# Patient Record
Sex: Male | Born: 1975
Health system: Southern US, Community
[De-identification: ages and names within clinical notes are randomized; demographics above are authoritative.]

## PROBLEM LIST (undated history)

## (undated) DIAGNOSIS — I10 Essential (primary) hypertension: Secondary | ICD-10-CM

## (undated) DIAGNOSIS — G4733 Obstructive sleep apnea (adult) (pediatric): Secondary | ICD-10-CM

## (undated) DIAGNOSIS — J189 Pneumonia, unspecified organism: Secondary | ICD-10-CM

## (undated) DIAGNOSIS — G479 Sleep disorder, unspecified: Secondary | ICD-10-CM

## (undated) DIAGNOSIS — Z720 Tobacco use: Secondary | ICD-10-CM

## (undated) DIAGNOSIS — E119 Type 2 diabetes mellitus without complications: Secondary | ICD-10-CM

## (undated) DIAGNOSIS — F32A Depression, unspecified: Secondary | ICD-10-CM

## (undated) DIAGNOSIS — F329 Major depressive disorder, single episode, unspecified: Secondary | ICD-10-CM

## (undated) DIAGNOSIS — A419 Sepsis, unspecified organism: Secondary | ICD-10-CM

## (undated) DIAGNOSIS — J309 Allergic rhinitis, unspecified: Secondary | ICD-10-CM

## (undated) DIAGNOSIS — E669 Obesity, unspecified: Secondary | ICD-10-CM

## (undated) DIAGNOSIS — D72829 Elevated white blood cell count, unspecified: Secondary | ICD-10-CM

## (undated) DIAGNOSIS — I509 Heart failure, unspecified: Secondary | ICD-10-CM

## (undated) DIAGNOSIS — F1010 Alcohol abuse, uncomplicated: Secondary | ICD-10-CM

## (undated) DIAGNOSIS — F17200 Nicotine dependence, unspecified, uncomplicated: Secondary | ICD-10-CM

## (undated) DIAGNOSIS — I519 Heart disease, unspecified: Secondary | ICD-10-CM

## (undated) DIAGNOSIS — J9600 Acute respiratory failure, unspecified whether with hypoxia or hypercapnia: Secondary | ICD-10-CM

## (undated) DIAGNOSIS — E785 Hyperlipidemia, unspecified: Secondary | ICD-10-CM

## (undated) HISTORY — DX: Pneumonia, unspecified organism: J18.9

## (undated) HISTORY — DX: Acute respiratory failure, unspecified whether with hypoxia or hypercapnia: J96.00

## (undated) HISTORY — DX: Elevated white blood cell count, unspecified: D72.829

## (undated) HISTORY — DX: Hyperlipidemia, unspecified: E78.5

## (undated) HISTORY — DX: Allergic rhinitis, unspecified: J30.9

## (undated) HISTORY — DX: Obstructive sleep apnea (adult) (pediatric): G47.33

## (undated) HISTORY — DX: Heart disease, unspecified: I51.9

## (undated) HISTORY — DX: Sleep disorder, unspecified: G47.9

## (undated) HISTORY — DX: Heart failure, unspecified: I50.9

## (undated) HISTORY — DX: Depression, unspecified: F32.A

## (undated) HISTORY — DX: Alcohol abuse, uncomplicated: F10.10

## (undated) HISTORY — DX: Sepsis, unspecified organism: A41.9

## (undated) HISTORY — DX: Major depressive disorder, single episode, unspecified: F32.9

## (undated) HISTORY — PX: NOSE SURGERY: SHX723

## (undated) HISTORY — DX: Nicotine dependence, unspecified, uncomplicated: F17.200

## (undated) HISTORY — DX: Tobacco use: Z72.0

---

## 2014-02-03 ENCOUNTER — Inpatient Hospital Stay (HOSPITAL_COMMUNITY)
Admission: EM | Admit: 2014-02-03 | Discharge: 2014-02-08 | DRG: 871 | Disposition: A | Discharge status: Home / Self Care | Payer: Medicare Other | Attending: Internal Medicine | Admitting: Internal Medicine

## 2014-02-03 ENCOUNTER — Encounter (HOSPITAL_COMMUNITY): Payer: Self-pay | Admitting: *Deleted

## 2014-02-03 ENCOUNTER — Emergency Department (HOSPITAL_COMMUNITY): Payer: Medicare Other

## 2014-02-03 DIAGNOSIS — J9601 Acute respiratory failure with hypoxia: Secondary | ICD-10-CM | POA: Diagnosis present

## 2014-02-03 DIAGNOSIS — IMO0001 Reserved for inherently not codable concepts without codable children: Secondary | ICD-10-CM | POA: Insufficient documentation

## 2014-02-03 DIAGNOSIS — J189 Pneumonia, unspecified organism: Secondary | ICD-10-CM | POA: Diagnosis present

## 2014-02-03 DIAGNOSIS — Z720 Tobacco use: Secondary | ICD-10-CM

## 2014-02-03 DIAGNOSIS — E870 Hyperosmolality and hypernatremia: Secondary | ICD-10-CM | POA: Diagnosis present

## 2014-02-03 DIAGNOSIS — R739 Hyperglycemia, unspecified: Secondary | ICD-10-CM | POA: Diagnosis present

## 2014-02-03 DIAGNOSIS — Z7982 Long term (current) use of aspirin: Secondary | ICD-10-CM

## 2014-02-03 DIAGNOSIS — Z6841 Body Mass Index (BMI) 40.0 and over, adult: Secondary | ICD-10-CM | POA: Diagnosis not present

## 2014-02-03 DIAGNOSIS — A419 Sepsis, unspecified organism: Secondary | ICD-10-CM

## 2014-02-03 DIAGNOSIS — R6520 Severe sepsis without septic shock: Secondary | ICD-10-CM | POA: Diagnosis present

## 2014-02-03 DIAGNOSIS — E876 Hypokalemia: Secondary | ICD-10-CM | POA: Diagnosis present

## 2014-02-03 DIAGNOSIS — G4733 Obstructive sleep apnea (adult) (pediatric): Secondary | ICD-10-CM | POA: Diagnosis present

## 2014-02-03 DIAGNOSIS — D72829 Elevated white blood cell count, unspecified: Secondary | ICD-10-CM | POA: Diagnosis present

## 2014-02-03 DIAGNOSIS — R509 Fever, unspecified: Secondary | ICD-10-CM

## 2014-02-03 DIAGNOSIS — J9600 Acute respiratory failure, unspecified whether with hypoxia or hypercapnia: Secondary | ICD-10-CM | POA: Diagnosis present

## 2014-02-03 HISTORY — DX: Obesity, unspecified: E66.9

## 2014-02-03 LAB — CBC WITH DIFFERENTIAL/PLATELET
BASOS ABS: 0 10*3/uL (ref 0.0–0.1)
Basophils Relative: 0 % (ref 0–1)
Eosinophils Absolute: 0 10*3/uL (ref 0.0–0.7)
Eosinophils Relative: 0 % (ref 0–5)
HCT: 42.1 % (ref 39.0–52.0)
Hemoglobin: 14.4 g/dL (ref 13.0–17.0)
Lymphocytes Relative: 6 % — ABNORMAL LOW (ref 12–46)
Lymphs Abs: 1.4 10*3/uL (ref 0.7–4.0)
MCH: 32.4 pg (ref 26.0–34.0)
MCHC: 34.2 g/dL (ref 30.0–36.0)
MCV: 94.8 fL (ref 78.0–100.0)
Monocytes Absolute: 2.7 10*3/uL — ABNORMAL HIGH (ref 0.1–1.0)
Monocytes Relative: 11 % (ref 3–12)
Neutro Abs: 20 10*3/uL — ABNORMAL HIGH (ref 1.7–7.7)
Neutrophils Relative %: 83 % — ABNORMAL HIGH (ref 43–77)
Platelets: 287 10*3/uL (ref 150–400)
RBC: 4.44 MIL/uL (ref 4.22–5.81)
RDW: 13.2 % (ref 11.5–15.5)
WBC: 24.2 10*3/uL — ABNORMAL HIGH (ref 4.0–10.5)

## 2014-02-03 LAB — URINALYSIS, ROUTINE W REFLEX MICROSCOPIC
GLUCOSE, UA: NEGATIVE mg/dL
KETONES UR: 15 mg/dL — AB
Nitrite: POSITIVE — AB
Protein, ur: 100 mg/dL — AB
Specific Gravity, Urine: 1.031 — ABNORMAL HIGH (ref 1.005–1.030)
Urobilinogen, UA: >8 mg/dL — ABNORMAL HIGH (ref 0.0–1.0)
pH: 5.5 (ref 5.0–8.0)

## 2014-02-03 LAB — I-STAT ARTERIAL BLOOD GAS, ED
Acid-Base Excess: 6 mmol/L — ABNORMAL HIGH (ref 0.0–2.0)
BICARBONATE: 28.4 meq/L — AB (ref 20.0–24.0)
O2 Saturation: 97 %
PH ART: 7.492 — AB (ref 7.350–7.450)
PO2 ART: 91 mmHg (ref 80.0–100.0)
Patient temperature: 102
TCO2: 29 mmol/L (ref 0–100)
pCO2 arterial: 37.7 mmHg (ref 35.0–45.0)

## 2014-02-03 LAB — COMPREHENSIVE METABOLIC PANEL
ALT: 36 U/L (ref 0–53)
AST: 29 U/L (ref 0–37)
Albumin: 3 g/dL — ABNORMAL LOW (ref 3.5–5.2)
Alkaline Phosphatase: 121 U/L — ABNORMAL HIGH (ref 39–117)
Anion gap: 18 — ABNORMAL HIGH (ref 5–15)
BUN: 11 mg/dL (ref 6–23)
CO2: 23 meq/L (ref 19–32)
Calcium: 9.3 mg/dL (ref 8.4–10.5)
Chloride: 95 mEq/L — ABNORMAL LOW (ref 96–112)
Creatinine, Ser: 0.69 mg/dL (ref 0.50–1.35)
GFR calc Af Amer: >90 mL/min (ref >90)
Glucose, Bld: 151 mg/dL — ABNORMAL HIGH (ref 70–99)
POTASSIUM: 4 meq/L (ref 3.7–5.3)
SODIUM: 136 meq/L — AB (ref 137–147)
Total Bilirubin: 0.7 mg/dL (ref 0.3–1.2)
Total Protein: 8.6 g/dL — ABNORMAL HIGH (ref 6.0–8.3)

## 2014-02-03 LAB — PROCALCITONIN: PROCALCITONIN: 0.61 ng/mL

## 2014-02-03 LAB — STREP PNEUMONIAE URINARY ANTIGEN: Strep Pneumo Urinary Antigen: NEGATIVE

## 2014-02-03 LAB — TROPONIN I: Troponin I: <0.3 ng/mL (ref <0.30)

## 2014-02-03 LAB — URINE MICROSCOPIC-ADD ON

## 2014-02-03 LAB — INFLUENZA PANEL BY PCR (TYPE A & B)
H1N1 flu by pcr: NOT DETECTED
Influenza A By PCR: NEGATIVE
Influenza B By PCR: NEGATIVE

## 2014-02-03 LAB — RAPID HIV SCREEN (WH-MAU): SUDS RAPID HIV SCREEN: NONREACTIVE

## 2014-02-03 LAB — I-STAT CG4 LACTIC ACID, ED
LACTIC ACID, VENOUS: 3.82 mmol/L — AB (ref 0.5–2.2)
Lactic Acid, Venous: 1.76 mmol/L (ref 0.5–2.2)

## 2014-02-03 LAB — CBG MONITORING, ED
GLUCOSE-CAPILLARY: 140 mg/dL — AB (ref 70–99)
Glucose-Capillary: 161 mg/dL — ABNORMAL HIGH (ref 70–99)

## 2014-02-03 LAB — GLUCOSE, CAPILLARY: Glucose-Capillary: 108 mg/dL — ABNORMAL HIGH (ref 70–99)

## 2014-02-03 LAB — PRO B NATRIURETIC PEPTIDE: PRO B NATRI PEPTIDE: 53.8 pg/mL (ref 0–125)

## 2014-02-03 LAB — LACTIC ACID, PLASMA: Lactic Acid, Venous: 1.2 mmol/L (ref 0.5–2.2)

## 2014-02-03 LAB — MRSA PCR SCREENING: MRSA by PCR: NEGATIVE

## 2014-02-03 IMAGING — CR DG CHEST 1V PORT
1 series · 1 of 1 positions shown · non-contrast
Comparison: None.

CLINICAL DATA: Fever and fatigue 2 days. Cough and shortness of
breath.

EXAM:
PORTABLE CHEST - 1 VIEW

[portable]
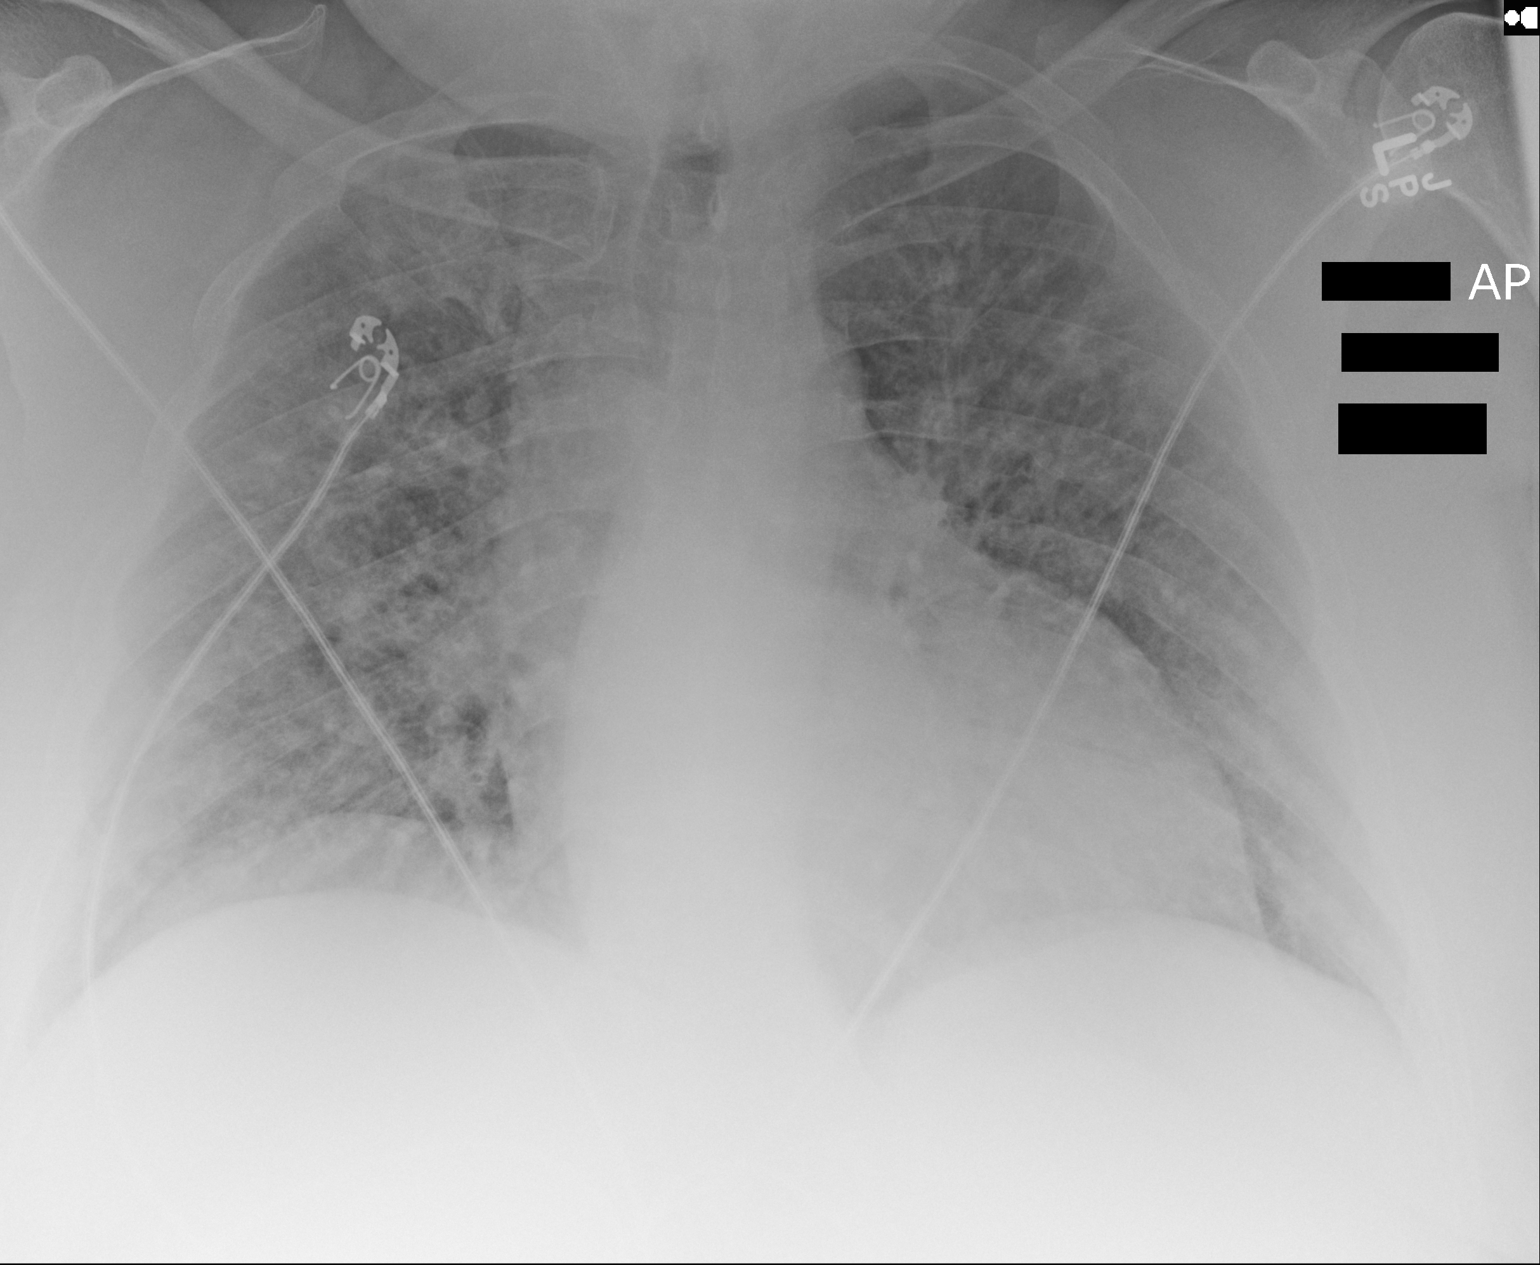

[1 of 1 positions shown; findings below may reference images not displayed]

FINDINGS: Lungs are adequately inflated and demonstrate bilateral diffuse
interstitial prominence slightly worse centrally and may reflect
mild interstitial edema, although cannot exclude interstitial
pneumonia. Mild cardiomegaly. No evidence of effusion. Mild
curvature of the thoracic spine convex to the right. Bones soft
tissues otherwise are unremarkable.
IMPRESSION: Diffuse bilateral increased interstitial prominence slightly worse
centrally may reflect mild interstitial edema, although cannot
exclude interstitial pneumonia.

## 2014-02-03 MED ORDER — SODIUM CHLORIDE 0.9 % IV SOLN
INTRAVENOUS | Status: DC
  Administered 2014-02-03: 17:00:00 via INTRAVENOUS

## 2014-02-03 MED ORDER — AZITHROMYCIN 250 MG PO TABS: 500.0000 mg | ORAL_TABLET | ORAL | Status: DC

## 2014-02-03 MED ORDER — DEXTROSE 5 % IV SOLN
1.0000 g | INTRAVENOUS | Status: DC
  Administered 2014-02-04 – 2014-02-06 (×3): 1 g via INTRAVENOUS
  Filled 2014-02-03 (×4): qty 10

## 2014-02-03 MED ORDER — SODIUM CHLORIDE 0.9 % IV BOLUS (SEPSIS)
1000.0000 mL | Freq: Once | INTRAVENOUS | Status: AC
  Administered 2014-02-03: 1000 mL via INTRAVENOUS

## 2014-02-03 MED ORDER — DEXTROSE 5 % IV SOLN: 1.0000 g | INTRAVENOUS | Status: DC

## 2014-02-03 MED ORDER — DEXTROSE 5 % IV SOLN
500.0000 mg | Freq: Once | INTRAVENOUS | Status: AC
  Administered 2014-02-03: 500 mg via INTRAVENOUS
  Filled 2014-02-03: qty 500

## 2014-02-03 MED ORDER — ACETAMINOPHEN 325 MG PO TABS
650.0000 mg | ORAL_TABLET | Freq: Once | ORAL | Status: AC
  Administered 2014-02-03: 650 mg via ORAL
  Filled 2014-02-03: qty 2

## 2014-02-03 MED ORDER — DEXTROSE 5 % IV SOLN: 500.0000 mg | INTRAVENOUS | Status: DC

## 2014-02-03 MED ORDER — ACETAMINOPHEN 500 MG PO TABS
1000.0000 mg | ORAL_TABLET | Freq: Once | ORAL | Status: AC
  Administered 2014-02-03: 1000 mg via ORAL
  Filled 2014-02-03: qty 2

## 2014-02-03 MED ORDER — HEPARIN SODIUM (PORCINE) 5000 UNIT/ML IJ SOLN
5000.0000 [IU] | Freq: Three times a day (TID) | INTRAMUSCULAR | Status: DC
  Administered 2014-02-03 – 2014-02-08 (×15): 5000 [IU] via SUBCUTANEOUS
  Filled 2014-02-03 (×19): qty 1

## 2014-02-03 MED ORDER — AZITHROMYCIN 500 MG PO TABS
500.0000 mg | ORAL_TABLET | Freq: Every day | ORAL | Status: DC
  Administered 2014-02-04 – 2014-02-06 (×3): 500 mg via ORAL
  Filled 2014-02-03 (×4): qty 1

## 2014-02-03 MED ORDER — INFLUENZA VAC SPLIT QUAD 0.5 ML IM SUSY
0.5000 mL | PREFILLED_SYRINGE | INTRAMUSCULAR | Status: DC
  Filled 2014-02-03: qty 0.5

## 2014-02-03 MED ORDER — DEXTROSE 5 % IV SOLN
1.0000 g | Freq: Once | INTRAVENOUS | Status: AC
  Administered 2014-02-03: 1 g via INTRAVENOUS
  Filled 2014-02-03: qty 10

## 2014-02-03 MED ORDER — IBUPROFEN 800 MG PO TABS
800.0000 mg | ORAL_TABLET | Freq: Once | ORAL | Status: AC
  Administered 2014-02-03: 800 mg via ORAL
  Filled 2014-02-03: qty 1

## 2014-02-03 NOTE — ED Notes (Signed)
Attempted to call report. Unable to give report at this time. Marylene LandAngela, RN [phone].

## 2014-02-03 NOTE — ED Notes (Signed)
CBG 161 

## 2014-02-03 NOTE — ED Provider Notes (Signed)
CSN: [id number]     Arrival date & time 02/03/14  1112 History   First MD Initiated Contact with Patient 02/03/14 1126     Chief Complaint  Patient presents with  . Fatigue  . Fever     (Consider location/radiation/quality/duration/timing/severity/associated sxs/prior Treatment) HPI Comments: Patient is a [age] year old male with history of obesity who presents the emergency department for evaluation of generalized fatigue and weakness. He has felt hot, but has not measured his temperature. He reports decreased appetite. He has a mild cough with productive green sputum. He has taken Alka-Seltzer cold and cough with minimal relief of his symptoms. He has felt mildly short of breath, but states this is not out of the ordinary. He attributes the shortness of breath to being overweight. Yesterday he began to have diaphoresis. He denies any chest pain. He is up and around his mother who is recovering from pneumonia.  The history is provided by the patient. No language interpreter was used.    Past Medical History  Diagnosis Date  . Obesity    History reviewed. No pertinent past surgical history. History reviewed. No pertinent family history. History  Substance Use Topics  . Smoking status: Current Every Day Smoker    Types: Cigarettes  . Smokeless tobacco: Not on file  . Alcohol Use: No    Review of Systems  Constitutional: Positive for fever, diaphoresis and appetite change. Negative for chills.  Respiratory: Positive for cough and shortness of breath.   Cardiovascular: Negative for chest pain.  Gastrointestinal: Negative for nausea, vomiting and abdominal pain.  Genitourinary: Negative for dysuria, urgency and frequency.  All other systems reviewed and are negative.     Allergies  Review of patient's allergies indicates no known allergies.  Home Medications   Prior to Admission medications   Medication Sig Start Date End Date Taking? Authorizing Provider  acetaminophen  (TYLENOL) 500 MG tablet Take 1,000 mg by mouth every 6 (six) hours as needed.   Yes Historical Provider, MD  Phenyleph-CPM-DM-Aspirin (ALKA-SELTZER PLUS COLD & COUGH PO) Take 2 tablets by mouth 2 (two) times daily as needed (for cold symptoms).   Yes Historical Provider, MD   BP 161/92 mmHg  Pulse 110  Temp(Src) 101.4 F (38.6 C) (Oral)  Resp 22  SpO2 94% Physical Exam  Constitutional: He is oriented to person, place, and time. He appears well-developed and well-nourished. He has a sickly appearance. He appears ill. No distress.  Diaphoretic Morbidly obese  HENT:  Head: Normocephalic and atraumatic.  Right Ear: External ear normal.  Left Ear: External ear normal.  Nose: Nose normal.  Eyes: Conjunctivae are normal.  Neck: Normal range of motion. No tracheal deviation present.  No nuchal rigidity or meningeal signs  Cardiovascular: Regular rhythm and normal heart sounds.  Tachycardia present.   Pulmonary/Chest: Effort normal. No stridor. He has wheezes. He has rales.  Abdominal: Soft. He exhibits no distension. There is no tenderness.  Protuberant abdomen  Musculoskeletal: Normal range of motion.  Neurological: He is alert and oriented to person, place, and time.  Skin: Skin is warm. He is diaphoretic.  Psychiatric: He has a normal mood and affect. His behavior is normal.  Nursing note and vitals reviewed.   ED Course  Procedures (including critical care time) Labs Review Labs Reviewed  CBC WITH DIFFERENTIAL - Abnormal; Notable for the following:    WBC 24.2 (*)    Neutrophils Relative % 83 (*)    Neutro Abs 20.0 (*)    Lymphocytes  Relative 6 (*)    Monocytes Absolute 2.7 (*)    All other components within normal limits  COMPREHENSIVE METABOLIC PANEL - Abnormal; Notable for the following:    Sodium 136 (*)    Chloride 95 (*)    Glucose, Bld 151 (*)    Total Protein 8.6 (*)    Albumin 3.0 (*)    Alkaline Phosphatase 121 (*)    Anion gap 18 (*)    All other components  within normal limits  I-STAT ARTERIAL BLOOD GAS, ED - Abnormal; Notable for the following:    pH, Arterial 7.492 (*)    Bicarbonate 28.4 (*)    Acid-Base Excess 6.0 (*)    All other components within normal limits  I-STAT CG4 LACTIC ACID, ED - Abnormal; Notable for the following:    Lactic Acid, Venous 3.82 (*)    All other components within normal limits  CBG MONITORING, ED - Abnormal; Notable for the following:    Glucose-Capillary 140 (*)    All other components within normal limits  CBG MONITORING, ED - Abnormal; Notable for the following:    Glucose-Capillary 161 (*)    All other components within normal limits  CULTURE, BLOOD (ROUTINE X 2)  CULTURE, BLOOD (ROUTINE X 2)  URINE CULTURE  PRO B NATRIURETIC PEPTIDE  RAPID HIV SCREEN ([facility])  TROPONIN I  URINALYSIS, ROUTINE W REFLEX MICROSCOPIC  INFLUENZA PANEL BY PCR (TYPE A & B, H1N1)  LACTIC ACID, PLASMA  I-STAT CG4 LACTIC ACID, ED  I-STAT CG4 LACTIC ACID, ED    Imaging Review Dg Chest Port 1 View  02/03/2014   CLINICAL DATA:  Fever and fatigue 2 days. Cough and shortness of breath.  EXAM: PORTABLE CHEST - 1 VIEW  COMPARISON:  None.  FINDINGS: Lungs are adequately inflated and demonstrate bilateral diffuse interstitial prominence slightly worse centrally and may reflect mild interstitial edema, although cannot exclude interstitial pneumonia. Mild cardiomegaly. No evidence of effusion. Mild curvature of the thoracic spine convex to the right. Bones soft tissues otherwise are unremarkable.  IMPRESSION: Diffuse bilateral increased interstitial prominence slightly worse centrally may reflect mild interstitial edema, although cannot exclude interstitial pneumonia.   Electronically Signed   By: Elberta Fortisaniel  Boyle M.D.   On: 02/03/2014 12:10     EKG Interpretation   Date/Time:  Friday February 03 2014 13:12:45 EST Ventricular Rate:  124 PR Interval:  166 QRS Duration: 90 QT Interval:  285 QTC Calculation: 409 R Axis:   -3 Text  Interpretation:  Sinus tachycardia Probable left atrial enlargement  ST elevation suggests acute pericarditis Baseline wander in lead(s) III V3  V4 V5 V6 No significant change was found Confirmed by Manus GunningANCOUR  MD, STEPHEN  [phone]) on 02/03/2014 1:53:14 PM       CRITICAL CARE Performed by: Junious SilkMerrell, Griselda Tosh   Total critical care time: 31 min  Critical care time was exclusive of separately billable procedures and treating other patients.  Critical care was necessary to treat or prevent imminent or life-threatening deterioration.  Critical care was time spent personally by me on the following activities: development of treatment plan with patient and/or surrogate as well as nursing, discussions with consultants, evaluation of patient's response to treatment, examination of patient, obtaining history from patient or surrogate, ordering and performing treatments and interventions, ordering and review of laboratory studies, ordering and review of radiographic studies, pulse oximetry and re-evaluation of patient's condition.   MDM   Final diagnoses:  Fever, unspecified fever cause  Sepsis, due to  unspecified organism   Patient presents to emergency department for evaluation of fever and generalized weakness. Patient was initially tachycardic to 131 and hypoxic with oxygen saturations and 70s on room air. Patient placed on 4 L of oxygen and oxygen saturation improved to 93%. Chest x-ray shows diffuse bilateral increased interstitial prominence slightly worse centrally may reflect interstitial edema, although cannot exclude interstitial pneumonia. We will treat with antibiotics for community-acquired pneumonia. Patient continues to become more diaphoretic in the emergency department. Fever and tachycardia improved. Lactic acid increased to 3.82 from 1.7 earlier in his stay. Will consult critical care. Critical care agrees to admit. Admission appreciated. Dr. Manus Gunning evaluated patient and agrees with  plan. Patient / Family / Caregiver informed of clinical course, understand medical decision-making process, and agree with plan.     Mora Bellman, PA-C 02/03/14 1627  Glynn Octave, MD 02/03/14 [phone]

## 2014-02-03 NOTE — Progress Notes (Signed)
ANTIBIOTIC CONSULT NOTE - INITIAL  Pharmacy Consult for Ceftriaxone/Azithromycin Indication: CAP  No Known Allergies  Vital Signs: Temp: 102.1 F (38.9 C) (11/20 1330) Temp Source: Oral (11/20 1330) BP: 135/63 mmHg (11/20 1330) Pulse Rate: 122 (11/20 1330) Intake/Output from previous day:   Intake/Output from this shift:    Labs:  Recent Labs  02/03/14 1201  WBC 24.2*  HGB 14.4  PLT 287  CREATININE 0.69   CrCl cannot be calculated (Unknown ideal weight.). No results for input(s): VANCOTROUGH, VANCOPEAK, VANCORANDOM, GENTTROUGH, GENTPEAK, GENTRANDOM, TOBRATROUGH, TOBRAPEAK, TOBRARND, AMIKACINPEAK, AMIKACINTROU, AMIKACIN in the last 72 hours.   Microbiology: No results found for this or any previous visit (from the past 720 hour(s)).  Medical History: Past Medical History  Diagnosis Date  . Obesity    Assessment: 38 yo m who presented to the ED on 11/20 after not feeling well for several days.  Pharmacy is consulted to begin ceftriaxone and azithromycin for CAP.  Wbc 24.2, tmax 102.1, SCr 0.69.  Goal of Therapy:  Eradication of infection  Plan:  Azithromycin 500 mg IV q24hrs Ceftriaxone 1gm IV q24hrs Monitor CBC, temperature curve, C&S, clinical course  Pharmacy will sign off since there are no dose adjustments for renal dysfunction.  Please re-consult if needed. Thank you!  Ivey Nembhard L. Roseanne RenoStewart, PharmD Clinical Pharmacy Resident Pager: [phone] 02/03/2014 2:09 PM

## 2014-02-03 NOTE — ED Notes (Signed)
Pt reports 'just not feeling good since Sunday night." reports feeling febrile, no appetite and generalized fatigue.

## 2014-02-03 NOTE — ED Notes (Signed)
MD at bedside.Rancour 

## 2014-02-03 NOTE — ED Notes (Signed)
Babcock at bedside.

## 2014-02-03 NOTE — H&P (Signed)
Name: Marc Galloway MRN: [id number] DOB: 1975/04/08    ADMISSION DATE:  02/03/2014 CONSULTATION DATE:  11/20  REFERRING MD :  Rancour   CHIEF COMPLAINT:  Acute respiratory failure and pna   BRIEF PATIENT DESCRIPTION:  38 year old white male admitted 11/20 for acute hypoxic resp failure in setting of probable CAP w/ associated Sepsis. PCCM asked to admit due to boarder line lactic acid and concern about O2 requirements (4 liters).   SIGNIFICANT EVENTS   STUDIES:  PCT 11/20>>> BCx2 11/20>>> U strep 11/20>>> U legionella 11/20>>> RVP 11/20>>>  HISTORY OF PRESENT ILLNESS:   This is a 38 year old male w/ recorded h/o obesity. Reports he was in his USOH until 11/16 when he began to have nasal congestion, malaise and possibly fever. The next day these symptoms continued and he also began to have chills and diaphoresis. He took over the counter alka-seltzer cold and OTC APAP but symptoms worsened and he began to have cough productive of green sputum on 11/18 and ongoing fever w/ malaise. By 11/20 the symptoms had not improved and he felt very weak, and SOB w/ exertion so he decided to come for evaluation. On arrival to the ER he was noted to be febrile w/ CXR c/w PNA. PCCM was asked to admit as pt appeared toxic and had boarderline lactic acid so ER team was concerned about risk of further complication.    PAST MEDICAL HISTORY :   has a past medical history of Obesity.  has no past surgical history on file. Prior to Admission medications   Medication Sig Start Date End Date Taking? Authorizing Provider  acetaminophen (TYLENOL) 500 MG tablet Take 1,000 mg by mouth every 6 (six) hours as needed.   Yes Historical Provider, MD  Phenyleph-CPM-DM-Aspirin (ALKA-SELTZER PLUS COLD & COUGH PO) Take 2 tablets by mouth 2 (two) times daily as needed (for cold symptoms).   Yes Historical Provider, MD   No Known Allergies  FAMILY HISTORY:  family history is not on file. SOCIAL HISTORY:  reports that  he has been smoking Cigarettes.  He has been smoking about 0.00 packs per day. He does not have any smokeless tobacco history on file. He reports that he does not drink alcohol or use illicit drugs.  REVIEW OF SYSTEMS bolds are +:   Constitutional:  fever, chills, weight loss, malaise/fatigue and diaphoresis.  HENT: Negative for hearing loss, ear pain, nosebleeds, congestion, sore throat, neck pain, tinnitus and ear discharge.   Eyes: Negative for blurred vision, double vision, photophobia, pain, discharge and redness.  Respiratory: Negative for, sputum production, green mucous  shortness of breath, wheezing and stridor.   Cardiovascular: Negative for chest pain, palpitations, orthopnea, claudication, leg swelling and PND.  Gastrointestinal: Negative for heartburn, nausea, vomiting, abdominal pain, diarrhea, constipation, blood in stool and melena.  Genitourinary: Negative for dysuria, urgency, frequency, hematuria and flank pain.  Musculoskeletal: Negative for myalgias, back pain, joint pain and falls.  Skin: Negative for itching and rash.  Neurological: Negative for dizziness, tingling, tremors, sensory change, speech change, focal weakness, seizures, loss of consciousness, weakness and headaches.  Endo/Heme/Allergies: Negative for environmental allergies and polydipsia. Does not bruise/bleed easily.  SUBJECTIVE:  Feels very weak VITAL SIGNS: Temp:  [99 F (37.2 C)-102.1 F (38.9 C)] 99 F (37.2 C) (11/20 1500) Pulse Rate:  [100-131] 100 (11/20 1515) Resp:  [18-27] 21 (11/20 1515) BP: (106-161)/(59-92) 119/64 mmHg (11/20 1515) SpO2:  [90 %-95 %] 90 % (11/20 1500) 4 liters  PHYSICAL EXAMINATION: General:  Obese white male, calm, cooperative, not in acute distress but appears ill  Neuro:  Awake, alert, intact. No focal def  HEENT:  Sesser, MMM no JVd  Cardiovascular:  Tachy rrr  Lungs:  Crackles in both posterior bases no accessory muscle use  Abdomen:  Round abd. Soft, non-tender +  bowel sounds  Musculoskeletal:  Intact  Skin:  Diaphoretic, CR brisk. Warm to touch   Recent Labs Lab 02/03/14 1201  NA 136*  K 4.0  CL 95*  CO2 23  BUN 11  CREATININE 0.69  GLUCOSE 151*    Recent Labs Lab 02/03/14 1201  HGB 14.4  HCT 42.1  WBC 24.2*  PLT 287    Recent Labs Lab 02/03/14 1201 02/03/14 1227 02/03/14 1427  WBC 24.2*  --   --   LATICACIDVEN  --  1.76 3.82*    Recent Labs Lab 02/03/14 1201  TROPONINI <0.30     ABG    Component Value Date/Time   PHART 7.492* 02/03/2014 1346   PCO2ART 37.7 02/03/2014 1346   PO2ART 91.0 02/03/2014 1346   HCO3 28.4* 02/03/2014 1346   TCO2 29 02/03/2014 1346   O2SAT 97.0 02/03/2014 1346     Dg Chest Port 1 View  02/03/2014   CLINICAL DATA:  Fever and fatigue 2 days. Cough and shortness of breath.  EXAM: PORTABLE CHEST - 1 VIEW  COMPARISON:  None.  FINDINGS: Lungs are adequately inflated and demonstrate bilateral diffuse interstitial prominence slightly worse centrally and may reflect mild interstitial edema, although cannot exclude interstitial pneumonia. Mild cardiomegaly. No evidence of effusion. Mild curvature of the thoracic spine convex to the right. Bones soft tissues otherwise are unremarkable.  IMPRESSION: Diffuse bilateral increased interstitial prominence slightly worse centrally may reflect mild interstitial edema, although cannot exclude interstitial pneumonia.   Electronically Signed   By: Elberta Fortisaniel  Boyle M.D.   On: 02/03/2014 12:10    ASSESSMENT / PLAN:  Acute hypoxic respiratory failure  CAP Sepsis Mild hypernatremia  Hyperglycemia  Obesity   Discussion  Think this is most likely CAP w/ resultant sepsis. He has no clear organ dysfxn but boarderline lactic acid raises concern for occult septic shock. He has had 2 liters of fluid thus far in the ER. Does feel a little better.  Plan Admit to SDU Repeat lactic acid after current fluid bolus to assure adequately resuscitated -->if rising may need  CVL  PCT algorithm Send sputum, legionella and strep antigens. Also send resp viral panel  Broad spec abx  rocephin 11/20>>> azithro 11/20>>> PRN BDs SSI   Anders SimmondsPete Kori Colin ACNP-BC [facility] Pager # [phone] OR # [phone] if no answer   02/03/2014, 3:44 PM

## 2014-02-04 ENCOUNTER — Inpatient Hospital Stay (HOSPITAL_COMMUNITY): Payer: Medicare Other

## 2014-02-04 LAB — GLUCOSE, CAPILLARY
GLUCOSE-CAPILLARY: 115 mg/dL — AB (ref 70–99)
Glucose-Capillary: 154 mg/dL — ABNORMAL HIGH (ref 70–99)
Glucose-Capillary: 114 mg/dL — ABNORMAL HIGH (ref 70–99)
Glucose-Capillary: 108 mg/dL — ABNORMAL HIGH (ref 70–99)

## 2014-02-04 LAB — BASIC METABOLIC PANEL
Anion gap: 15 (ref 5–15)
BUN: 8 mg/dL (ref 6–23)
CO2: 24 mEq/L (ref 19–32)
Calcium: 8.7 mg/dL (ref 8.4–10.5)
Chloride: 98 mEq/L (ref 96–112)
Creatinine, Ser: 0.54 mg/dL (ref 0.50–1.35)
GFR calc Af Amer: >90 mL/min (ref >90)
GFR calc non Af Amer: >90 mL/min (ref >90)
GLUCOSE: 153 mg/dL — AB (ref 70–99)
POTASSIUM: 3.6 meq/L — AB (ref 3.7–5.3)
Sodium: 137 mEq/L (ref 137–147)

## 2014-02-04 LAB — CBC
HCT: 38.1 % — ABNORMAL LOW (ref 39.0–52.0)
Hemoglobin: 12.5 g/dL — ABNORMAL LOW (ref 13.0–17.0)
MCH: 31.7 pg (ref 26.0–34.0)
MCHC: 32.8 g/dL (ref 30.0–36.0)
MCV: 96.7 fL (ref 78.0–100.0)
Platelets: 295 10*3/uL (ref 150–400)
RBC: 3.94 MIL/uL — AB (ref 4.22–5.81)
RDW: 13.4 % (ref 11.5–15.5)
WBC: 20.2 10*3/uL — AB (ref 4.0–10.5)

## 2014-02-04 LAB — URINE CULTURE: Colony Count: 6000

## 2014-02-04 LAB — TSH: TSH: 0.352 u[IU]/mL (ref 0.350–4.500)

## 2014-02-04 LAB — PROCALCITONIN: PROCALCITONIN: 0.45 ng/mL

## 2014-02-04 LAB — PHOSPHORUS: PHOSPHORUS: 2.3 mg/dL (ref 2.3–4.6)

## 2014-02-04 LAB — HIV ANTIBODY (ROUTINE TESTING W REFLEX): HIV 1&2 Ab, 4th Generation: NONREACTIVE

## 2014-02-04 LAB — MAGNESIUM: Magnesium: 1.8 mg/dL (ref 1.5–2.5)

## 2014-02-04 IMAGING — CR DG CHEST 1V PORT
1 series · 1 of 1 positions shown · non-contrast
Comparison: [DATE]

CLINICAL DATA: Pneumonia, hypoxic respiratory failure and sepsis.

EXAM:
PORTABLE CHEST - 1 VIEW

[AP]
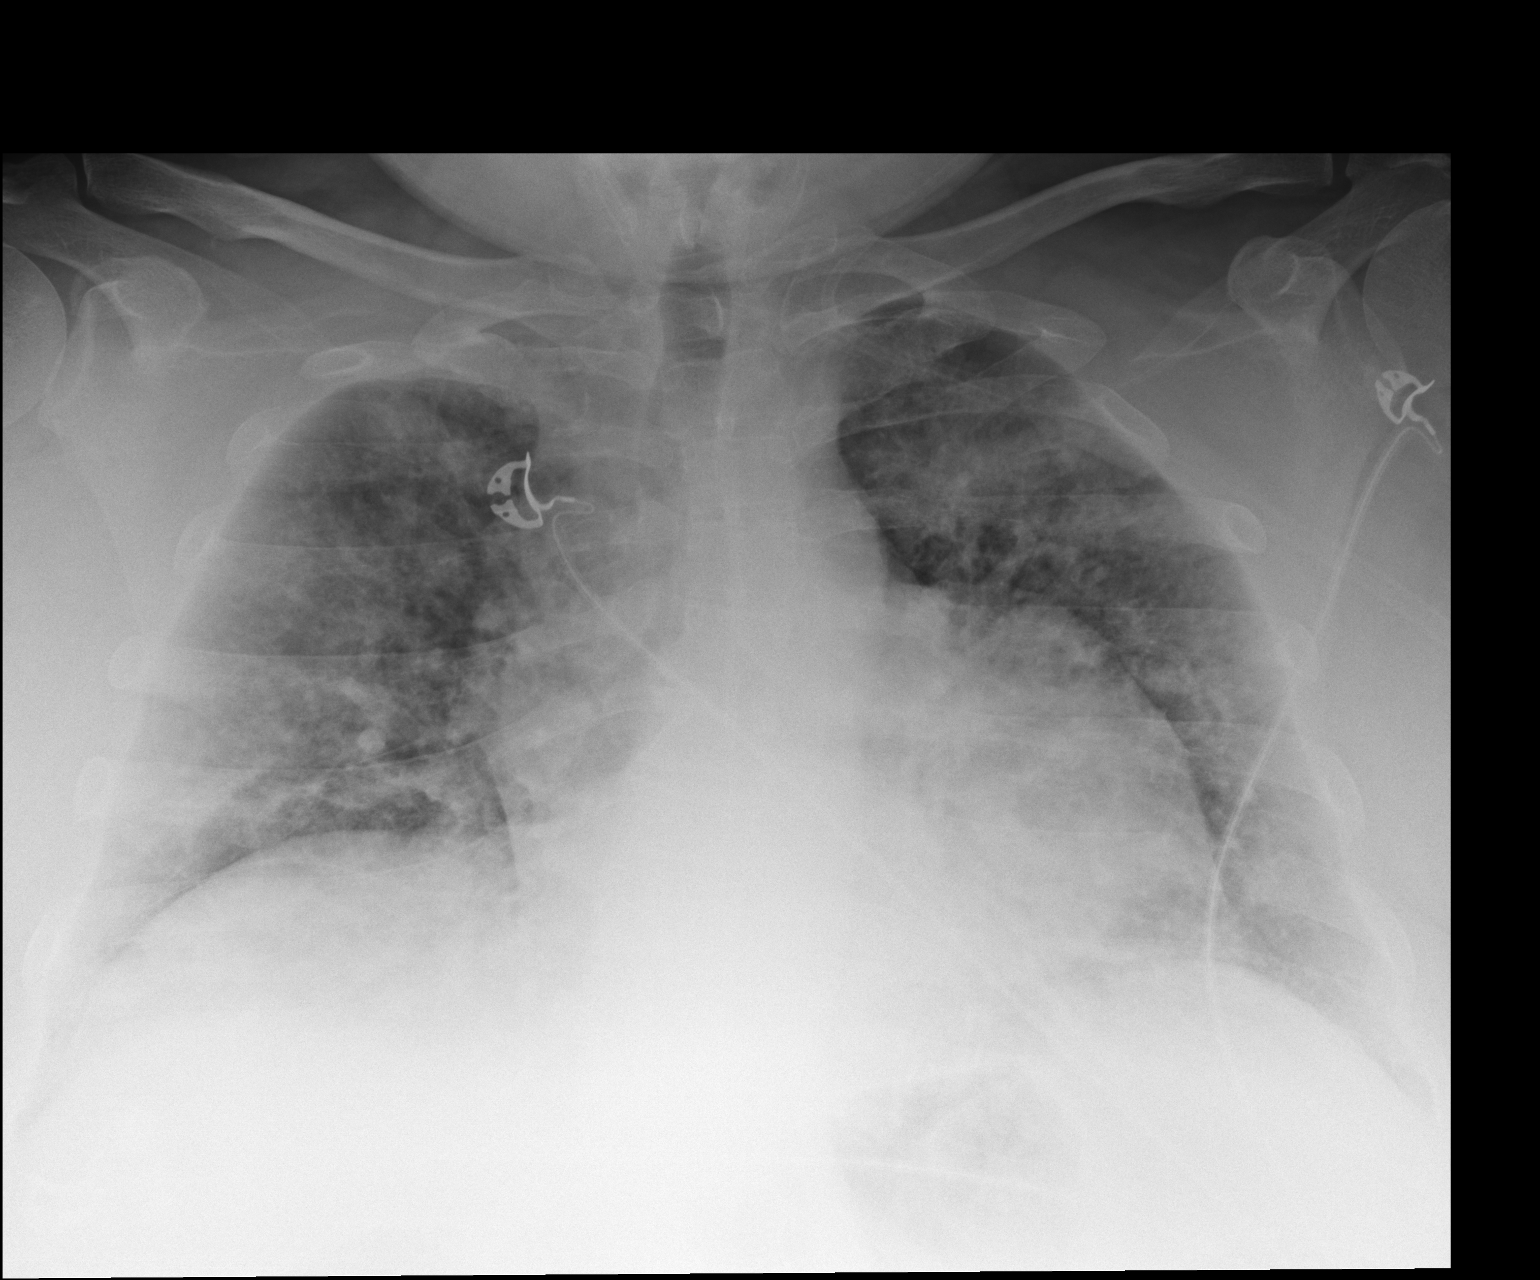

[1 of 1 positions shown; findings below may reference images not displayed]

FINDINGS: Lung volumes are lower bilaterally. There remains diffuse coarsening
of lung markings bilaterally with interstitial prominence and
potential areas of more focal infiltrates bilaterally. No
pneumothorax or significant pleural fluid is seen. There is stable
cardiomegaly.
IMPRESSION: Significant reduction in bilateral lung volumes. There remains
pattern of predominately interstitial pulmonary infiltrates
bilaterally.

## 2014-02-04 NOTE — Progress Notes (Signed)
Name: Marc Galloway MRN: [id number] DOB: [birth year]    ADMISSION DATE:  02/03/2014 CONSULTATION DATE:  11/20  REFERRING MD :  Rancour   CHIEF COMPLAINT:  Acute respiratory failure and pna   BRIEF PATIENT DESCRIPTION:  38 year old white male admitted 11/20 for acute hypoxic resp failure in setting of probable CAP w/ associated Sepsis. PCCM asked to admit due to boarder line lactic acid and concern about O2 requirements (4 liters).   SIGNIFICANT EVENTS   STUDIES:  PCT 11/20>>> BCx2 11/20>>> U strep 11/20>>> U legionella 11/20>>> RVP 11/20>>>  SUBJECTIVE:  Feels better, walking the halls.  VITAL SIGNS: Temp:  [98.3 F (36.8 C)-102.1 F (38.9 C)] 98.3 F (36.8 C) (11/21 0749) Pulse Rate:  [82-131] 110 (11/21 0749) Resp:  [15-32] 18 (11/21 0749) BP: (106-176)/(46-94) 171/93 mmHg (11/21 0749) SpO2:  [90 %-96 %] 90 % (11/21 0749) Weight:  [130.908 kg (288 lb 9.6 oz)] 130.908 kg (288 lb 9.6 oz) (11/20 1956) 4 liters  PHYSICAL EXAMINATION: General:  Obese white male, calm, cooperative, not in acute distress Neuro:  Awake, alert, intact. No focal def  HEENT:  , MMM no JVd  Cardiovascular:  Tachy rrr  Lungs:  Crackles in both posterior bases no accessory muscle use  Abdomen:  Round abd. Soft, non-tender + bowel sounds  Musculoskeletal:  Intact  Skin:  Diaphoretic, CR brisk. Warm to touch   Recent Labs Lab 02/03/14 1201 02/04/14 0330  NA 136* 137  K 4.0 3.6*  CL 95* 98  CO2 23 24  BUN 11 8  CREATININE 0.69 0.54  GLUCOSE 151* 153*    Recent Labs Lab 02/03/14 1201 02/04/14 0330  HGB 14.4 12.5*  HCT 42.1 38.1*  WBC 24.2* 20.2*  PLT 287 295    Recent Labs Lab 02/03/14 1201 02/03/14 1227 02/03/14 1427 02/03/14 1602 02/03/14 1617 02/04/14 0330  PROCALCITON  --   --   --   --  0.61 0.45  WBC 24.2*  --   --   --   --  20.2*  LATICACIDVEN  --  1.76 3.82* 1.2  --   --     Recent Labs Lab 02/03/14 1201  TROPONINI <0.30     ABG    Component Value  Date/Time   PHART 7.492* 02/03/2014 1346   PCO2ART 37.7 02/03/2014 1346   PO2ART 91.0 02/03/2014 1346   HCO3 28.4* 02/03/2014 1346   TCO2 29 02/03/2014 1346   O2SAT 97.0 02/03/2014 1346     Dg Chest Port 1 View  02/04/2014   CLINICAL DATA:  Pneumonia, hypoxic respiratory failure and sepsis.  EXAM: PORTABLE CHEST - 1 VIEW  COMPARISON:  02/03/2014  FINDINGS: Lung volumes are lower bilaterally. There remains diffuse coarsening of lung markings bilaterally with interstitial prominence and potential areas of more focal infiltrates bilaterally. No pneumothorax or significant pleural fluid is seen. There is stable cardiomegaly.  IMPRESSION: Significant reduction in bilateral lung volumes. There remains pattern of predominately interstitial pulmonary infiltrates bilaterally.   Electronically Signed   By: Irish LackGlenn  Yamagata M.D.   On: 02/04/2014 09:15   Dg Chest Port 1 View  02/03/2014   CLINICAL DATA:  Fever and fatigue 2 days. Cough and shortness of breath.  EXAM: PORTABLE CHEST - 1 VIEW  COMPARISON:  None.  FINDINGS: Lungs are adequately inflated and demonstrate bilateral diffuse interstitial prominence slightly worse centrally and may reflect mild interstitial edema, although cannot exclude interstitial pneumonia. Mild cardiomegaly. No evidence of effusion. Mild curvature of  the thoracic spine convex to the right. Bones soft tissues otherwise are unremarkable.  IMPRESSION: Diffuse bilateral increased interstitial prominence slightly worse centrally may reflect mild interstitial edema, although cannot exclude interstitial pneumonia.   Electronically Signed   By: Elberta Fortisaniel  Boyle M.D.   On: 02/03/2014 12:10    ASSESSMENT / PLAN:  Acute hypoxic respiratory failure  CAP Sepsis Mild hypernatremia  Hyperglycemia  Obesity   Discussion  Think this is most likely CAP w/ resultant sepsis. He has no clear organ dysfxn but boarderline lactic acid raises concern for occult septic shock. He has had 2 liters of  fluid thus far in the ER. Does feel a little better.  Plan Lactic acid improved, no TLC PCT algorithm noted Send sputum, legionella and strep antigens. Also send resp viral panel all pending. Broad spec abx  Rocephin 11/20>>> Azithro 11/20>>> PRN BDs SSI  [facility] will sign off, please call back if needed.  Alyson ReedyWesam G. Yacoub, M.D. [facility]. Pager: [phone]. After hours pager: [phone].

## 2014-02-05 DIAGNOSIS — E876 Hypokalemia: Secondary | ICD-10-CM

## 2014-02-05 DIAGNOSIS — J189 Pneumonia, unspecified organism: Secondary | ICD-10-CM

## 2014-02-05 DIAGNOSIS — J9601 Acute respiratory failure with hypoxia: Secondary | ICD-10-CM

## 2014-02-05 DIAGNOSIS — D72829 Elevated white blood cell count, unspecified: Secondary | ICD-10-CM

## 2014-02-05 LAB — GLUCOSE, CAPILLARY
GLUCOSE-CAPILLARY: 154 mg/dL — AB (ref 70–99)
GLUCOSE-CAPILLARY: 103 mg/dL — AB (ref 70–99)
GLUCOSE-CAPILLARY: 107 mg/dL — AB (ref 70–99)
Glucose-Capillary: 111 mg/dL — ABNORMAL HIGH (ref 70–99)

## 2014-02-05 LAB — CBC
HCT: 39.3 % (ref 39.0–52.0)
Hemoglobin: 13.2 g/dL (ref 13.0–17.0)
MCH: 33 pg (ref 26.0–34.0)
MCHC: 33.6 g/dL (ref 30.0–36.0)
MCV: 98.3 fL (ref 78.0–100.0)
PLATELETS: 298 10*3/uL (ref 150–400)
RBC: 4 MIL/uL — AB (ref 4.22–5.81)
RDW: 13.3 % (ref 11.5–15.5)
WBC: 17 10*3/uL — ABNORMAL HIGH (ref 4.0–10.5)

## 2014-02-05 LAB — BASIC METABOLIC PANEL
Anion gap: 18 — ABNORMAL HIGH (ref 5–15)
BUN: 6 mg/dL (ref 6–23)
CO2: 21 meq/L (ref 19–32)
Calcium: 8.9 mg/dL (ref 8.4–10.5)
Chloride: 95 mEq/L — ABNORMAL LOW (ref 96–112)
Creatinine, Ser: 0.51 mg/dL (ref 0.50–1.35)
GFR calc non Af Amer: >90 mL/min (ref >90)
Glucose, Bld: 127 mg/dL — ABNORMAL HIGH (ref 70–99)
POTASSIUM: 3.6 meq/L — AB (ref 3.7–5.3)
SODIUM: 134 meq/L — AB (ref 137–147)

## 2014-02-05 LAB — MAGNESIUM: MAGNESIUM: 2 mg/dL (ref 1.5–2.5)

## 2014-02-05 LAB — PROCALCITONIN: Procalcitonin: 0.73 ng/mL

## 2014-02-05 LAB — PHOSPHORUS: Phosphorus: 3.4 mg/dL (ref 2.3–4.6)

## 2014-02-05 MED ORDER — POTASSIUM CHLORIDE CRYS ER 20 MEQ PO TBCR
20.0000 meq | EXTENDED_RELEASE_TABLET | Freq: Once | ORAL | Status: AC
  Administered 2014-02-05: 20 meq via ORAL
  Filled 2014-02-05: qty 1

## 2014-02-05 NOTE — Plan of Care (Signed)
Problem: Phase I Progression Outcomes Goal: Hemodynamically stable Outcome: Completed/Met Date Met:  02/05/14     

## 2014-02-05 NOTE — Plan of Care (Signed)
Problem: Phase II Progression Outcomes Goal: Progress activity as tolerated unless otherwise ordered Outcome: Completed/Met Date Met:  02/05/14     

## 2014-02-05 NOTE — Plan of Care (Signed)
Problem: Phase II Progression Outcomes Goal: Vital signs remain stable Outcome: Completed/Met Date Met:  02/05/14

## 2014-02-05 NOTE — Plan of Care (Signed)
Problem: Phase II Progression Outcomes Goal: Obtain order to discontinue catheter if appropriate Outcome: Not Applicable Date Met:  60/10/93

## 2014-02-05 NOTE — Progress Notes (Signed)
Report given to RN from [facility] [place], patient transferred room 2W 26, no complications.  Corliss SkainsJuan Manvir Prabhu RN

## 2014-02-05 NOTE — Plan of Care (Signed)
Problem: Phase I Progression Outcomes Goal: Voiding-avoid urinary catheter unless indicated Outcome: Completed/Met Date Met:  02/05/14     

## 2014-02-05 NOTE — Progress Notes (Signed)
Patient ID: [name], male   DOB: 03-10-1976, 38 y.o.   MRN: [id number] [facility] PROGRESS NOTE  Marc Galloway BRA:[id number] DOB: 1975/06/10 DOA: 02/03/2014 PCP: No primary care provider on file.  Brief narrative:    38 year old male with no significant past medical history who presented to [facility] ED 02/03/2014 with respiratory distress. PCCM initially admitted due to concern for pneumonia, sepsis and high oxygen requirement. TRH as primary starting 02/05/2014.   Assessment/Plan:    Active Problems: Acute respiratory failure with hypoxia / community acquired pneumonia - respiratory status stable but requires [place] oxygen support. - continue current antibiotics, azithromycin and rocephin  - strep pneumonia negative, influenza negative, HIV non reactive, blood cultures to date are negative  - may transfer to telemetry today  Active Problems: Sepsis / Leukocytosis - sepsis criteria met on admission with feer, tachycardia, tachypnea, hypoxia. In addition, elevated WBC count. Source of infection - pneumonia. - continue azithromycin and rocephin  Hypokalemia - possibly related to sepsis - supplemented - follow up BMP in am   DVT Prophylaxis  - heparin sub Q ordered   Code Status: Full.  Family Communication:  plan of care discussed with the patient Disposition Plan: transfer to telemetry floor; still requires oxygen support via [place] about 3-4 L, slight hypokalemia   IV access:   Peripheral IV  Procedures and diagnostic studies:    Dg Chest Port 1 View 03/05/14    Significant reduction in bilateral lung volumes. There remains pattern of predominately interstitial pulmonary infiltrates bilaterally.    Dg Chest Port 1 View 02/03/2014   Diffuse bilateral increased interstitial prominence slightly worse centrally may reflect mild interstitial edema, although cannot exclude interstitial pneumonia.     Medical Consultants:   None, PCCM initially primary, TRH primary starting  02/05/2014   Other Consultants:   None   IAnti-Infectives:    Azithromycin 03/05/2014 -->  Rocephin 2014/03/05 -->   Marc Lenz, MD  [facility] Pager [phone]  If 7PM-7AM, please contact night-coverage [email or web address] Password [facility] 02/05/2014, 7:35 AM   LOS: 2 days    HPI/Subjective: No acute overnight events.  Objective: Filed Vitals:   03-05-2014 2348 02/05/14 0000 02/05/14 0354 02/05/14 0400  BP: 149/54  152/81   Pulse: 90 99 106 89  Temp: 98.7 F (37.1 C)  98.8 F (37.1 C)   TempSrc: Oral  Oral   Resp: 17  18   Height:      Weight:      SpO2: 91% 85% 95% 89%    Intake/Output Summary (Last 24 hours) at 02/05/14 0735 Last data filed at 02/05/14 0630  Gross per 24 hour  Intake    955 ml  Output   1180 ml  Net   -225 ml    Exam:   General:  Pt is alert, follows commands appropriately, not in acute distress  Cardiovascular: Regular rate and rhythm, S1/S2, no murmurs  Respiratory: no wheezing, no crackles   Abdomen: Soft, non tender, non distended, bowel sounds present  Extremities: No edema, pulses DP and PT palpable bilaterally  Neuro: Grossly nonfocal  Data Reviewed: Basic Metabolic Panel:  Recent Labs Lab 02/03/14 1201 03/05/2014 0330 02/05/14 0340  NA 136* 137 134*  K 4.0 3.6* 3.6*  CL 95* 98 95*  CO2 $Re'23 24 21  'GsL$ GLUCOSE 151* 153* 127*  BUN $Re'11 8 6  'yFQ$ CREATININE 0.69 0.54 0.51  CALCIUM 9.3 8.7 8.9  MG  --  1.8 2.0  PHOS  --  2.3 3.4   Liver Function Tests:  Recent Labs Lab 02/03/14 1201  AST 29  ALT 36  ALKPHOS 121*  BILITOT 0.7  PROT 8.6*  ALBUMIN 3.0*   No results for input(s): LIPASE, AMYLASE in the last 168 hours. No results for input(s): AMMONIA in the last 168 hours. CBC:  Recent Labs Lab 02/03/14 1201 02/04/14 0330 02/05/14 0340  WBC 24.2* 20.2* 17.0*  NEUTROABS 20.0*  --   --   HGB 14.4 12.5* 13.2  HCT 42.1 38.1* 39.3  MCV 94.8 96.7 98.3  PLT 287 295 298   Cardiac Enzymes:  Recent Labs Lab  02/03/14 1201  TROPONINI <0.30   BNP: Invalid input(s): POCBNP CBG:  Recent Labs Lab 02/03/14 2140 02/04/14 0747 02/04/14 1130 02/04/14 1637 02/04/14 2154  GLUCAP 108* 154* 114* 115* 108*    Blood Culture (routine x 2)     Status: None (Preliminary result)   Collection Time: 02/03/14 12:01 PM  Result Value Ref Range Status   Specimen Description BLOOD LEFT HAND  Final   Special Requests BOTTLES DRAWN AEROBIC AND ANAEROBIC 10CC  Final   Culture  Setup Time   Final   Culture   Final           BLOOD CULTURE RECEIVED NO GROWTH TO DATE    Report Status PENDING  Incomplete  Blood Culture (routine x 2)     Status: None (Preliminary result)   Collection Time: 02/03/14 12:15 PM  Result Value Ref Range Status   Specimen Description BLOOD RIGHT ANTECUBITAL  Final   Special Requests BOTTLES DRAWN AEROBIC AND ANAEROBIC 10CC  Final   Culture  Setup Time   Final   Culture   Final           BLOOD CULTURE RECEIVED NO GROWTH TO DATE   Report Status PENDING  Incomplete  Urine culture     Status: None   Collection Time: 02/03/14  5:25 PM  Result Value Ref Range Status   Specimen Description URINE, CLEAN CATCH  Final   Special Requests NONE  Final   Culture  Setup Time   Final   Colony Count   Final   Culture   Final    INSIGNIFICANT GROWTH Performed at [facility]    Report Status 02/04/2014 FINAL  Final  MRSA PCR Screening     Status: None   Collection Time: 02/03/14  7:19 PM  Result Value Ref Range Status   MRSA by PCR NEGATIVE NEGATIVE Final    Comment:           Scheduled Meds: . azithromycin  500 mg Oral Daily  . cefTRIAXone (ROCEPHIN)  IV  1 g Intravenous Q24H  . heparin  5,000 Units Subcutaneous 3 times per day  . Influenza vac split quadrivalent PF  0.5 mL Intramuscular Tomorrow-1000  . potassium chloride  20 mEq Oral Once   Continuous Infusions: . sodium chloride 125 mL/hr at 02/03/14 1719

## 2014-02-05 NOTE — Plan of Care (Signed)
Problem: Phase I Progression Outcomes Goal: Initial discharge plan identified Outcome: Completed/Met Date Met:  02/05/14     

## 2014-02-06 LAB — RESPIRATORY VIRUS PANEL
ADENOVIRUS: NOT DETECTED
Influenza A H1: NOT DETECTED
Influenza A H3: NOT DETECTED
Influenza A: NOT DETECTED
Influenza B: NOT DETECTED
METAPNEUMOVIRUS: NOT DETECTED
PARAINFLUENZA 1 A: NOT DETECTED
Parainfluenza 2: NOT DETECTED
Parainfluenza 3: NOT DETECTED
Respiratory Syncytial Virus A: NOT DETECTED
Respiratory Syncytial Virus B: NOT DETECTED
Rhinovirus: NOT DETECTED

## 2014-02-06 LAB — EXPECTORATED SPUTUM ASSESSMENT W GRAM STAIN, RFLX TO RESP C

## 2014-02-06 LAB — LEGIONELLA ANTIGEN, URINE

## 2014-02-06 LAB — GLUCOSE, CAPILLARY
Glucose-Capillary: 132 mg/dL — ABNORMAL HIGH (ref 70–99)
Glucose-Capillary: 155 mg/dL — ABNORMAL HIGH (ref 70–99)
Glucose-Capillary: 129 mg/dL — ABNORMAL HIGH (ref 70–99)
Glucose-Capillary: 107 mg/dL — ABNORMAL HIGH (ref 70–99)

## 2014-02-06 LAB — EXPECTORATED SPUTUM ASSESSMENT W REFEX TO RESP CULTURE

## 2014-02-06 MED ORDER — LEVALBUTEROL HCL 1.25 MG/0.5ML IN NEBU
1.2500 mg | INHALATION_SOLUTION | Freq: Three times a day (TID) | RESPIRATORY_TRACT | Status: DC
  Administered 2014-02-06 – 2014-02-07 (×3): 1.25 mg via RESPIRATORY_TRACT
  Filled 2014-02-06 (×7): qty 0.5

## 2014-02-06 MED ORDER — LEVALBUTEROL HCL 1.25 MG/0.5ML IN NEBU
1.2500 mg | INHALATION_SOLUTION | Freq: Four times a day (QID) | RESPIRATORY_TRACT | Status: DC | PRN
  Filled 2014-02-06: qty 0.5

## 2014-02-06 NOTE — Progress Notes (Signed)
Utilization review completed. Avery Klingbeil, RN, BSN. 

## 2014-02-06 NOTE — Progress Notes (Signed)
Medicare Important Message given? YES  (If response is "NO", the following Medicare IM given date fields will be blank)  Date Medicare IM given: 02/06/14 Medicare IM given by:  Gerber Penza  

## 2014-02-06 NOTE — Progress Notes (Addendum)
Patient ID: Marc Galloway, male   DOB: 1975-08-10, 38 y.o.   MRN: [id number]  [facility] PROGRESS NOTE  Marc Galloway FGH:[id number] DOB: [birth year] DOA: 02/03/2014 PCP: No primary care provider on file.  Brief narrative:    38 year old male with no significant past medical history who presented to [facility] ED 02/03/2014 with respiratory distress. PCCM initially admitted due to concern for pneumonia, sepsis and high oxygen requirement. TRH as primary starting 02/05/2014.   Assessment/Plan:    Active Problems: Acute respiratory failure with hypoxia / community acquired pneumonia - respiratory status stable but requires [place] oxygen support. - continue current antibiotics, azithromycin and rocephin  - strep pneumonia negative, influenza negative, HIV non reactive, blood cultures to date are negative  - check oxygen saturation with ambulation  - provide BD's scheduled and as needed   Active Problems: Sepsis / Leukocytosis - sepsis criteria met on admission with feer, tachycardia, tachypnea, hypoxia. In addition, elevated WBC count.  - Source of infection - pneumonia. - continue azithromycin and rocephin day #3 - WBC trending down  Hypokalemia - still slightly low - continue to supplement  - follow up BMP in am  Morbid obesity, BMI > 40  DVT Prophylaxis  - heparin sub Q ordered   Code Status: Full.  Family Communication: plan of care discussed with the patient Disposition Plan: possible d/c in 1-2 days   IV access:   Peripheral IV  Procedures and diagnostic studies:   Dg Chest Port 1 View 02/04/2014 Significant reduction in bilateral lung volumes. There remains pattern of predominately interstitial pulmonary infiltrates bilaterally.   Dg Chest Port 1 View 02/03/2014 Diffuse bilateral increased interstitial prominence slightly worse centrally may reflect mild interstitial edema, although cannot exclude interstitial pneumonia.   Medical Consultants:    None, PCCM initially primary, TRH primary starting 02/05/2014  Other Consultants:   None  IAnti-Infectives:    Azithromycin 02/04/2014 -->  Rocephin 02/04/2014 -->  Faye Ramsay, MD  [facility] Pager [phone]  If 7PM-7AM, please contact night-coverage [email or web address] Password [email or web address] 02/06/2014, 1:29 PM   LOS: 3 days   HPI/Subjective: No events overnight.   Objective: Filed Vitals:   02/05/14 1144 02/05/14 1615 02/05/14 2009 02/06/14 0500  BP: 125/56 158/80 136/75 127/86  Pulse: 85 95 112 101  Temp: 98.5 F (36.9 C) 98.2 F (36.8 C) 99.3 F (37.4 C) 98.2 F (36.8 C)  TempSrc: Oral Oral Oral Oral  Resp: $Remo'20 24 22 22  'EMCIY$ Height:    '5\' 6"'$  (1.676 m)  Weight:    128.459 kg (283 lb 3.2 oz)  SpO2: 93% 90% 92% 92%    Intake/Output Summary (Last 24 hours) at 02/06/14 1329 Last data filed at 02/06/14 0800  Gross per 24 hour  Intake    440 ml  Output    501 ml  Net    -61 ml    Exam:   General:  Pt is alert, follows commands appropriately, not in acute distress  Cardiovascular: Regular rhythm, tachy, S1/S2, no murmurs, no rubs, no gallops  Respiratory: Clear to auscultation bilaterally, mild exp wheezing with rhonchi at bases   Abdomen: Soft, non tender, non distended, bowel sounds present, no guarding  Extremities: pulses DP and PT palpable bilaterally  Neuro: Grossly nonfocal  Data Reviewed: Basic Metabolic Panel:  Recent Labs Lab 02/03/14 1201 02/04/14 0330 02/05/14 0340  NA 136* 137 134*  K 4.0 3.6* 3.6*  CL 95* 98 95*  CO2 $Re'23 24 21  'PQt$ GLUCOSE 151* 153* 127*  BUN '11 8 6  '$ CREATININE 0.69 0.54 0.51  CALCIUM 9.3 8.7 8.9  MG  --  1.8 2.0  PHOS  --  2.3 3.4   Liver Function Tests:  Recent Labs Lab 02/03/14 1201  AST 29  ALT 36  ALKPHOS 121*  BILITOT 0.7  PROT 8.6*  ALBUMIN 3.0*   CBC:  Recent Labs Lab 02/03/14 1201 02/04/14 0330 02/05/14 0340  WBC 24.2* 20.2* 17.0*  NEUTROABS 20.0*  --   --   HGB 14.4 12.5* 13.2  HCT 42.1  38.1* 39.3  MCV 94.8 96.7 98.3  PLT 287 295 298   Cardiac Enzymes:  Recent Labs Lab 02/03/14 1201  TROPONINI <0.30   CBG:  Recent Labs Lab 02/05/14 1148 02/05/14 1618 02/05/14 2152 02/06/14 0544 02/06/14 1109  GLUCAP 111* 103* 107* 132* 155*    Recent Results (from the past 240 hour(s))  Blood Culture (routine x 2)     Status: None (Preliminary result)   Collection Time: 02/03/14 12:01 PM  Result Value Ref Range Status   Specimen Description BLOOD LEFT HAND  Final   Special Requests BOTTLES DRAWN AEROBIC AND ANAEROBIC 10CC  Final   Culture  Setup Time   Final    02/03/2014 17:05 Performed at [facility]    Culture   Final           BLOOD CULTURE RECEIVED NO GROWTH TO DATE CULTURE WILL BE HELD FOR 5 DAYS BEFORE ISSUING A FINAL NEGATIVE REPORT Performed at [facility]    Report Status PENDING  Incomplete  Blood Culture (routine x 2)     Status: None (Preliminary result)   Collection Time: 02/03/14 12:15 PM  Result Value Ref Range Status   Specimen Description BLOOD RIGHT ANTECUBITAL  Final   Special Requests BOTTLES DRAWN AEROBIC AND ANAEROBIC 10CC  Final   Culture  Setup Time   Final    02/03/2014 17:04 Performed at [facility]    Culture   Final           BLOOD CULTURE RECEIVED NO GROWTH TO DATE CULTURE WILL BE HELD FOR 5 DAYS BEFORE ISSUING A FINAL NEGATIVE REPORT Performed at [facility]    Report Status PENDING  Incomplete  Urine culture     Status: None   Collection Time: 02/03/14  5:25 PM  Result Value Ref Range Status   Specimen Description URINE, CLEAN CATCH  Final   Special Requests NONE  Final   Culture  Setup Time   Final    02/03/2014 23:02 Performed at [place]   Final    6,000 COLONIES/ML Performed at [facility]    Culture   Final    INSIGNIFICANT GROWTH Performed at [facility]    Report Status 02/04/2014 FINAL  Final  MRSA PCR Screening     Status:  None   Collection Time: 02/03/14  7:19 PM  Result Value Ref Range Status   MRSA by PCR NEGATIVE NEGATIVE Final    Comment:        The GeneXpert MRSA Assay (FDA approved for NASAL specimens only), is one component of a comprehensive MRSA colonization surveillance program. It is not intended to diagnose MRSA infection nor to guide or monitor treatment for MRSA infections.   Culture, sputum-assessment     Status: None   Collection Time: 02/06/14  2:12 AM  Result Value Ref Range Status   Specimen Description SPUTUM  Final  Special Requests NONE  Final   Sputum evaluation   Final    THIS SPECIMEN IS ACCEPTABLE. RESPIRATORY CULTURE REPORT TO FOLLOW.   Report Status 02/06/2014 FINAL  Final     Scheduled Meds: . azithromycin  500 mg Oral Daily  . cefTRIAXone (ROCEPHIN)  IV  1 g Intravenous Q24H  . heparin  5,000 Units Subcutaneous 3 times per day  . Influenza vac split quadrivalent PF  0.5 mL Intramuscular Tomorrow-1000   Continuous Infusions:

## 2014-02-07 DIAGNOSIS — D72829 Elevated white blood cell count, unspecified: Secondary | ICD-10-CM | POA: Diagnosis present

## 2014-02-07 DIAGNOSIS — A419 Sepsis, unspecified organism: Secondary | ICD-10-CM | POA: Diagnosis present

## 2014-02-07 DIAGNOSIS — J189 Pneumonia, unspecified organism: Secondary | ICD-10-CM | POA: Diagnosis present

## 2014-02-07 LAB — CBC
HEMATOCRIT: 38.1 % — AB (ref 39.0–52.0)
Hemoglobin: 12.5 g/dL — ABNORMAL LOW (ref 13.0–17.0)
MCH: 32.2 pg (ref 26.0–34.0)
MCHC: 32.8 g/dL (ref 30.0–36.0)
MCV: 98.2 fL (ref 78.0–100.0)
Platelets: 370 10*3/uL (ref 150–400)
RBC: 3.88 MIL/uL — ABNORMAL LOW (ref 4.22–5.81)
RDW: 13.3 % (ref 11.5–15.5)
WBC: 14 10*3/uL — AB (ref 4.0–10.5)

## 2014-02-07 LAB — BASIC METABOLIC PANEL
ANION GAP: 15 (ref 5–15)
BUN: 8 mg/dL (ref 6–23)
CHLORIDE: 95 meq/L — AB (ref 96–112)
CO2: 28 mEq/L (ref 19–32)
CREATININE: 0.69 mg/dL (ref 0.50–1.35)
Calcium: 9.2 mg/dL (ref 8.4–10.5)
GFR calc Af Amer: >90 mL/min (ref >90)
GFR calc non Af Amer: >90 mL/min (ref >90)
Glucose, Bld: 125 mg/dL — ABNORMAL HIGH (ref 70–99)
Potassium: 4 mEq/L (ref 3.7–5.3)
Sodium: 138 mEq/L (ref 137–147)

## 2014-02-07 LAB — GLUCOSE, CAPILLARY
Glucose-Capillary: 122 mg/dL — ABNORMAL HIGH (ref 70–99)
Glucose-Capillary: 119 mg/dL — ABNORMAL HIGH (ref 70–99)
Glucose-Capillary: 141 mg/dL — ABNORMAL HIGH (ref 70–99)
Glucose-Capillary: 113 mg/dL — ABNORMAL HIGH (ref 70–99)

## 2014-02-07 MED ORDER — LEVALBUTEROL HCL 1.25 MG/0.5ML IN NEBU
1.2500 mg | INHALATION_SOLUTION | Freq: Three times a day (TID) | RESPIRATORY_TRACT | Status: DC
  Administered 2014-02-07 – 2014-02-08 (×3): 1.25 mg via RESPIRATORY_TRACT
  Filled 2014-02-07 (×6): qty 0.5

## 2014-02-07 MED ORDER — LEVOFLOXACIN 750 MG PO TABS
750.0000 mg | ORAL_TABLET | Freq: Every day | ORAL | Status: DC
  Administered 2014-02-07 – 2014-02-08 (×2): 750 mg via ORAL
  Filled 2014-02-07 (×2): qty 1

## 2014-02-07 NOTE — Plan of Care (Signed)
Problem: Phase I Progression Outcomes Goal: Pain controlled with appropriate interventions Outcome: Completed/Met Date Met:  02/07/14     

## 2014-02-07 NOTE — Progress Notes (Signed)
Pt color was ashen with blues lips and finger tips o2 stats 92% on room air lung sound diminished and pt was doing pursed breathing O2 2L with humidified air placed and O2 stats 98% pt color pinked and Pt was able to complete sentences he stated that he was feeling weak but better with the Oxygen. Will continue to monitor. Ilean SkillVeronica Kamilya Wakeman LPN

## 2014-02-07 NOTE — Progress Notes (Signed)
Placed patient on CPAP auto mode 5-20cmH20 with 3L02 bleed in.  Patient is tolerating well at this time. 

## 2014-02-07 NOTE — Progress Notes (Signed)
[facility] PROGRESS NOTE Interim History: 38 year old male with no significant past medical history who presented to [facility] ED 02/03/2014 with respiratory distress. PCCM initially admitted due to concern for pneumonia, sepsis and high oxygen requirement. TRH as primary starting 02/05/2014.   Assessment/Plan: Acute respiratory failure due to CAP (community acquired pneumonia)/Leukocytosis: - Started on rocephin and azithro.de-escalate to levaquin. - Has defervesce, pt has has been desaturating with ambulation. - Respiratory status stable but requires [place] oxygen support.  Sepsis: - resolved.  Code Status: Full.  Family Communication: plan of care discussed with the patient Disposition Plan: possible d/c in 1-2 days    Consultants:  none  Procedures:  CXR  Antibiotics:  Rocephin and azithro  HPI/Subjective: Feels better  Objective: Filed Vitals:   02/06/14 2147 02/06/14 2150 02/07/14 0133 02/07/14 0441  BP: 122/69   128/67  Pulse: 102 93 99 102  Temp: 98.2 F (36.8 C)   100 F (37.8 C)  TempSrc: Oral   Oral  Resp: _0 Height:      Weight:      SpO2: 93% 97% 93% 95%    Intake/Output Summary (Last 24 hours) at 02/07/14 1022 Last data filed at 02/06/14 1700  Gross per 24 hour  Intake    600 ml  Output      0 ml  Net    600 ml   Filed Weights   02/03/14 1956 02/06/14 0500  Weight: 130.908 kg (288 lb 9.6 oz) 128.459 kg (283 lb 3.2 oz)    Exam:  General: Alert, awake, oriented x3, in no acute distress.  HEENT: No bruits, no goiter.  Heart: Regular rate and rhythm.  Lungs: Good air movement, clear Abdomen: Soft, nontender, nondistended, positive bowel sounds.    Data Reviewed: Basic Metabolic Panel:  Recent Labs Lab 02/03/14 1201 02/04/14 0330 02/05/14 0340 02/07/14 0422  NA 136* 137 134* 138  K 4.0 3.6* 3.6* 4.0  CL 95* 98 95* 95*  CO2 _1 GLUCOSE 151* 153* 127* 125*  BUN _2 CREATININE 0.69 0.54 0.51 0.69    CALCIUM 9.3 8.7 8.9 9.2  MG  --  1.8 2.0  --   PHOS  --  2.3 3.4  --    Liver Function Tests:  Recent Labs Lab 02/03/14 1201  AST 29  ALT 36  ALKPHOS 121*  BILITOT 0.7  PROT 8.6*  ALBUMIN 3.0*   No results for input(s): LIPASE, AMYLASE in the last 168 hours. No results for input(s): AMMONIA in the last 168 hours. CBC:  Recent Labs Lab 02/03/14 1201 02/04/14 0330 02/05/14 0340 02/07/14 0422  WBC 24.2* 20.2* 17.0* 14.0*  NEUTROABS 20.0*  --   --   --   HGB 14.4 12.5* 13.2 12.5*  HCT 42.1 38.1* 39.3 38.1*  MCV 94.8 96.7 98.3 98.2  PLT 287 295 298 370   Cardiac Enzymes:  Recent Labs Lab 02/03/14 1201  TROPONINI <0.30   BNP (last 3 results)  Recent Labs  02/03/14 1240  PROBNP 53.8   CBG:  Recent Labs Lab 02/06/14 0544 02/06/14 1109 02/06/14 1626 02/06/14 2142 02/07/14 0641  GLUCAP 132* 155* 129* 107* 122*    Recent Results (from the past 240 hour(s))  Blood Culture (routine x 2)     Status: None (Preliminary result)   Collection Time: 02/03/14 12:01 PM  Result Value Ref Range Status   Specimen Description BLOOD LEFT HAND  Final  Special Requests BOTTLES DRAWN AEROBIC AND ANAEROBIC 10CC  Final   Culture  Setup Time   Final    02/03/2014 17:05 Performed at [facility]    Culture   Final           BLOOD CULTURE RECEIVED NO GROWTH TO DATE CULTURE WILL BE HELD FOR 5 DAYS BEFORE ISSUING A FINAL NEGATIVE REPORT Performed at [facility]    Report Status PENDING  Incomplete  Blood Culture (routine x 2)     Status: None (Preliminary result)   Collection Time: 02/03/14 12:15 PM  Result Value Ref Range Status   Specimen Description BLOOD RIGHT ANTECUBITAL  Final   Special Requests BOTTLES DRAWN AEROBIC AND ANAEROBIC 10CC  Final   Culture  Setup Time   Final    02/03/2014 17:04 Performed at [facility]    Culture   Final           BLOOD CULTURE RECEIVED NO GROWTH TO DATE CULTURE WILL BE HELD FOR 5 DAYS BEFORE ISSUING A  FINAL NEGATIVE REPORT Performed at [facility]    Report Status PENDING  Incomplete  Urine culture     Status: None   Collection Time: 02/03/14  5:25 PM  Result Value Ref Range Status   Specimen Description URINE, CLEAN CATCH  Final   Special Requests NONE  Final   Culture  Setup Time   Final    02/03/2014 23:02 Performed at [place]   Final    6,000 COLONIES/ML Performed at [facility]    Culture   Final    INSIGNIFICANT GROWTH Performed at [facility]    Report Status 02/04/2014 FINAL  Final  MRSA PCR Screening     Status: None   Collection Time: 02/03/14  7:19 PM  Result Value Ref Range Status   MRSA by PCR NEGATIVE NEGATIVE Final    Comment:        The GeneXpert MRSA Assay (FDA approved for NASAL specimens only), is one component of a comprehensive MRSA colonization surveillance program. It is not intended to diagnose MRSA infection nor to guide or monitor treatment for MRSA infections.   Respiratory virus antigens panel     Status: None   Collection Time: 02/05/14  4:58 PM  Result Value Ref Range Status   Source - RVPAN NASAL SWAB  Corrected    Comment: CORRECTED ON 11/23 AT 2120: PREVIOUSLY REPORTED AS NASAL SWAB   Respiratory Syncytial Virus A NOT DETECTED  Final   Respiratory Syncytial Virus B NOT DETECTED  Final   Influenza A NOT DETECTED  Final   Influenza B NOT DETECTED  Final   Parainfluenza 1 NOT DETECTED  Final   Parainfluenza 2 NOT DETECTED  Final   Parainfluenza 3 NOT DETECTED  Final   Metapneumovirus NOT DETECTED  Final   Rhinovirus NOT DETECTED  Final   Adenovirus NOT DETECTED  Final   Influenza A H1 NOT DETECTED  Final   Influenza A H3 NOT DETECTED  Final    Comment: (NOTE)       Normal Reference Range for each Analyte: NOT DETECTED Testing performed using the Luminex xTAG Respiratory Viral Panel test kit. The analytical performance characteristics of this assay have been determined by  [facility].  The modifications have not been cleared or approved by the FDA. This assay has been validated pursuant to the CLIA regulations and is used for clinical purposes.  Performed at [facility], sputum-assessment     Status: None   Collection Time: 02/06/14  2:12 AM  Result Value Ref Range Status   Specimen Description SPUTUM  Final   Special Requests NONE  Final   Sputum evaluation   Final    THIS SPECIMEN IS ACCEPTABLE. RESPIRATORY CULTURE REPORT TO FOLLOW.   Report Status 02/06/2014 FINAL  Final  Culture, respiratory (NON-Expectorated)     Status: None (Preliminary result)   Collection Time: 02/06/14  2:12 AM  Result Value Ref Range Status   Specimen Description SPUTUM  Final   Special Requests NONE  Final   Gram Stain   Final    MODERATE WBC PRESENT,BOTH PMN AND MONONUCLEAR RARE SQUAMOUS EPITHELIAL CELLS PRESENT MODERATE GRAM NEGATIVE RODS MODERATE GRAM POSITIVE COCCI IN PAIRS MODERATE GRAM POSITIVE RODS    Culture PENDING  Incomplete   Report Status PENDING  Incomplete     Studies: No results found.  Scheduled Meds: . azithromycin  500 mg Oral Daily  . cefTRIAXone (ROCEPHIN)  IV  1 g Intravenous Q24H  . heparin  5,000 Units Subcutaneous 3 times per day  . Influenza vac split quadrivalent PF  0.5 mL Intramuscular Tomorrow-1000  . levalbuterol  1.25 mg Nebulization TID   Continuous Infusions:    Charlynne Cousins  [facility] Pager [phone]. If 8PM-8AM, please contact night-coverage at [email or web address], password [facility] 02/07/2014, 10:22 AM  LOS: 4 days

## 2014-02-08 LAB — CULTURE, RESPIRATORY W GRAM STAIN

## 2014-02-08 LAB — GLUCOSE, CAPILLARY
Glucose-Capillary: 132 mg/dL — ABNORMAL HIGH (ref 70–99)
Glucose-Capillary: 87 mg/dL (ref 70–99)

## 2014-02-08 LAB — CULTURE, RESPIRATORY: CULTURE: NORMAL

## 2014-02-08 MED ORDER — LEVOFLOXACIN 750 MG PO TABS: 750.0000 mg | ORAL_TABLET | Freq: Every day | ORAL | Status: DC

## 2014-02-08 NOTE — Care Management Note (Signed)
    Page 1 of 1   02/08/2014     2:29:26 PM CARE MANAGEMENT NOTE 02/08/2014  Patient:  Marc Galloway,Marc Galloway   Account Number:  [id number]  Date Initiated:  02/06/2014  Documentation initiated by:  Donn PieriniWEBSTER,Haliey Romberg  Subjective/Objective Assessment:   Pt admitted with sepsis, acute resp. failure     Action/Plan:   PTA pt lived at home   Anticipated DC Date:  02/07/2014   Anticipated DC Plan:  HOME/SELF CARE         Choice offered to / List presented to:             Status of service:  Completed, signed off Medicare Important Message given?  YES (If response is "NO", the following Medicare IM given date fields will be blank) Date Medicare IM given:  02/06/2014 Medicare IM given by:  Donn PieriniWEBSTER,Lalia Loudon Date Additional Medicare IM given:   Additional Medicare IM given by:    Discharge Disposition:  HOME/SELF CARE  Per UR Regulation:  Reviewed for med. necessity/level of care/duration of stay  If discussed at Long Length of Stay Meetings, dates discussed:    Comments:

## 2014-02-08 NOTE — Discharge Summary (Signed)
Physician Discharge Summary  Marc Galloway YOA:[id number] DOB: 09-29-1975 DOA: 02/03/2014  PCP: No primary care provider on file.  Admit date: 02/03/2014 Discharge date: 02/08/2014  Time spent: 30 minutes  Recommendations for Outpatient Follow-up:  1. Follow up with PCP in 2 weeks.  Discharge Diagnoses:  Principal Problem:   CAP (community acquired pneumonia) Active Problems:   Acute respiratory failure   Sepsis   Leukocytosis   Discharge Condition: stable  Diet recommendation: low carb diet  Filed Weights   02/03/14 1956 02/06/14 0500  Weight: 130.908 kg (288 lb 9.6 oz) 128.459 kg (283 lb 3.2 oz)    History of present illness:  38 year old white male admitted 11/20 for acute hypoxic resp failure in setting of probable CAP w/ associated Sepsis. PCCM asked to admit due to boarder line lactic acid and concern about O2 requirements (4 liters).   Hospital Course:  Acute respiratory failure due to CAP (community acquired pneumonia)/Leukocytosis: - Started on rocephin and azithro.de-escalate to levaquin. - Has defervesce, saturation improved and pt not desating with ambulation.  Sepsis: - resolved. Due to above.  OSA/OHS: - pt did not desaturate when checking oxygen, '  Procedures:  CXR  Consultations:  PCCM  Discharge Exam: Filed Vitals:   02/08/14 0502  BP: 141/87  Pulse: 98  Temp: 98.7 F (37.1 C)  Resp: 18    General: A&O x3 Cardiovascular: RRR Respiratory: good air movement CTA B/L  Discharge Instructions You were cared for by a hospitalist during your hospital stay. If you have any questions about your discharge medications or the care you received while you were in the hospital after you are discharged, you can call the unit and asked to speak with the hospitalist on call if the hospitalist that took care of you is not available. Once you are discharged, your primary care physician will handle any further medical issues. Please note that NO REFILLS  for any discharge medications will be authorized once you are discharged, as it is imperative that you return to your primary care physician (or establish a relationship with a primary care physician if you do not have one) for your aftercare needs so that they can reassess your need for medications and monitor your lab values.  Discharge Instructions    Diet - low sodium heart healthy    Complete by:  As directed      Increase activity slowly    Complete by:  As directed           Current Discharge Medication List    START taking these medications   Details  levofloxacin (LEVAQUIN) 750 MG tablet Take 1 tablet (750 mg total) by mouth daily. Qty: 2 tablet, Refills: 0      CONTINUE these medications which have NOT CHANGED   Details  acetaminophen (TYLENOL) 500 MG tablet Take 1,000 mg by mouth every 6 (six) hours as needed.    Phenyleph-CPM-DM-Aspirin (ALKA-SELTZER PLUS COLD & COUGH PO) Take 2 tablets by mouth 2 (two) times daily as needed (for cold symptoms).       No Known Allergies    The results of significant diagnostics from this hospitalization (including imaging, microbiology, ancillary and laboratory) are listed below for reference.    Significant Diagnostic Studies: Dg Chest Port 1 View  02/04/2014   CLINICAL DATA:  Pneumonia, hypoxic respiratory failure and sepsis.  EXAM: PORTABLE CHEST - 1 VIEW  COMPARISON:  02/03/2014  FINDINGS: Lung volumes are lower bilaterally. There remains diffuse coarsening of  lung markings bilaterally with interstitial prominence and potential areas of more focal infiltrates bilaterally. No pneumothorax or significant pleural fluid is seen. There is stable cardiomegaly.  IMPRESSION: Significant reduction in bilateral lung volumes. There remains pattern of predominately interstitial pulmonary infiltrates bilaterally.   Electronically Signed   By: Aletta Edouard M.D.   On: 02/04/2014 09:15   Dg Chest Port 1 View  02/03/2014   CLINICAL DATA:   Fever and fatigue 2 days. Cough and shortness of breath.  EXAM: PORTABLE CHEST - 1 VIEW  COMPARISON:  None.  FINDINGS: Lungs are adequately inflated and demonstrate bilateral diffuse interstitial prominence slightly worse centrally and may reflect mild interstitial edema, although cannot exclude interstitial pneumonia. Mild cardiomegaly. No evidence of effusion. Mild curvature of the thoracic spine convex to the right. Bones soft tissues otherwise are unremarkable.  IMPRESSION: Diffuse bilateral increased interstitial prominence slightly worse centrally may reflect mild interstitial edema, although cannot exclude interstitial pneumonia.   Electronically Signed   By: Marin Olp M.D.   On: 02/03/2014 12:10    Microbiology: Recent Results (from the past 240 hour(s))  Blood Culture (routine x 2)     Status: None (Preliminary result)   Collection Time: 02/03/14 12:01 PM  Result Value Ref Range Status   Specimen Description BLOOD LEFT HAND  Final   Special Requests BOTTLES DRAWN AEROBIC AND ANAEROBIC 10CC  Final   Culture  Setup Time   Final    02/03/2014 17:05 Performed at [facility]    Culture   Final           BLOOD CULTURE RECEIVED NO GROWTH TO DATE CULTURE WILL BE HELD FOR 5 DAYS BEFORE ISSUING A FINAL NEGATIVE REPORT Performed at [facility]    Report Status PENDING  Incomplete  Blood Culture (routine x 2)     Status: None (Preliminary result)   Collection Time: 02/03/14 12:15 PM  Result Value Ref Range Status   Specimen Description BLOOD RIGHT ANTECUBITAL  Final   Special Requests BOTTLES DRAWN AEROBIC AND ANAEROBIC 10CC  Final   Culture  Setup Time   Final    02/03/2014 17:04 Performed at [facility]    Culture   Final           BLOOD CULTURE RECEIVED NO GROWTH TO DATE CULTURE WILL BE HELD FOR 5 DAYS BEFORE ISSUING A FINAL NEGATIVE REPORT Performed at [facility]    Report Status PENDING  Incomplete  Urine culture     Status: None    Collection Time: 02/03/14  5:25 PM  Result Value Ref Range Status   Specimen Description URINE, CLEAN CATCH  Final   Special Requests NONE  Final   Culture  Setup Time   Final    02/03/2014 23:02 Performed at [facility]   Final    6,000 COLONIES/ML Performed at [facility]    Culture   Final    INSIGNIFICANT GROWTH Performed at [facility]    Report Status 02/04/2014 FINAL  Final  MRSA PCR Screening     Status: None   Collection Time: 02/03/14  7:19 PM  Result Value Ref Range Status   MRSA by PCR NEGATIVE NEGATIVE Final    Comment:        The GeneXpert MRSA Assay (FDA approved for NASAL specimens only), is one component of a comprehensive MRSA colonization surveillance program. It is not intended to diagnose MRSA infection nor to  guide or monitor treatment for MRSA infections.   Respiratory virus antigens panel     Status: None   Collection Time: 02/05/14  4:58 PM  Result Value Ref Range Status   Source - RVPAN NASAL SWAB  Corrected    Comment: CORRECTED ON 11/23 AT 2120: PREVIOUSLY REPORTED AS NASAL SWAB   Respiratory Syncytial Virus A NOT DETECTED  Final   Respiratory Syncytial Virus B NOT DETECTED  Final   Influenza A NOT DETECTED  Final   Influenza B NOT DETECTED  Final   Parainfluenza 1 NOT DETECTED  Final   Parainfluenza 2 NOT DETECTED  Final   Parainfluenza 3 NOT DETECTED  Final   Metapneumovirus NOT DETECTED  Final   Rhinovirus NOT DETECTED  Final   Adenovirus NOT DETECTED  Final   Influenza A H1 NOT DETECTED  Final   Influenza A H3 NOT DETECTED  Final    Comment: (NOTE)       Normal Reference Range for each Analyte: NOT DETECTED Testing performed using the Luminex xTAG Respiratory Viral Panel test kit. The analytical performance characteristics of this assay have been determined by [facility].  The modifications have not been cleared or approved by the FDA. This assay has been validated pursuant to  the CLIA regulations and is used for clinical purposes. Performed at [facility], sputum-assessment     Status: None   Collection Time: 02/06/14  2:12 AM  Result Value Ref Range Status   Specimen Description SPUTUM  Final   Special Requests NONE  Final   Sputum evaluation   Final    THIS SPECIMEN IS ACCEPTABLE. RESPIRATORY CULTURE REPORT TO FOLLOW.   Report Status 02/06/2014 FINAL  Final  Culture, respiratory (NON-Expectorated)     Status: None   Collection Time: 02/06/14  2:12 AM  Result Value Ref Range Status   Specimen Description SPUTUM  Final   Special Requests NONE  Final   Gram Stain   Final    MODERATE WBC PRESENT,BOTH PMN AND MONONUCLEAR RARE SQUAMOUS EPITHELIAL CELLS PRESENT MODERATE GRAM NEGATIVE RODS MODERATE GRAM POSITIVE COCCI IN PAIRS MODERATE GRAM POSITIVE RODS    Culture   Final    NORMAL OROPHARYNGEAL FLORA Performed at [facility]    Report Status 02/08/2014 FINAL  Final     Labs: Basic Metabolic Panel:  Recent Labs Lab 02/03/14 1201 02/04/14 0330 02/05/14 0340 02/07/14 0422  NA 136* 137 134* 138  K 4.0 3.6* 3.6* 4.0  CL 95* 98 95* 95*  CO2 $Re'23 24 21 28  'eNJ$ GLUCOSE 151* 153* 127* 125*  BUN $Re'11 8 6 8  'uRx$ CREATININE 0.69 0.54 0.51 0.69  CALCIUM 9.3 8.7 8.9 9.2  MG  --  1.8 2.0  --   PHOS  --  2.3 3.4  --    Liver Function Tests:  Recent Labs Lab 02/03/14 1201  AST 29  ALT 36  ALKPHOS 121*  BILITOT 0.7  PROT 8.6*  ALBUMIN 3.0*   No results for input(s): LIPASE, AMYLASE in the last 168 hours. No results for input(s): AMMONIA in the last 168 hours. CBC:  Recent Labs Lab 02/03/14 1201 02/04/14 0330 02/05/14 0340 02/07/14 0422  WBC 24.2* 20.2* 17.0* 14.0*  NEUTROABS 20.0*  --   --   --   HGB 14.4 12.5* 13.2 12.5*  HCT 42.1 38.1* 39.3 38.1*  MCV 94.8 96.7 98.3 98.2  PLT 287 295 298 370   Cardiac Enzymes:  Recent Labs  Lab 02/03/14 1201  TROPONINI <0.30   BNP: BNP (last 3 results)  Recent Labs   02/03/14 1240  PROBNP 53.8   CBG:  Recent Labs Lab 02/06/14 2142 02/07/14 0641 02/07/14 1144 02/07/14 1650 02/07/14 2134  GLUCAP 107* 122* 119* 141* 113*       Signed:  FELIZ ORTIZ, Marc Galloway  [facility] 02/08/2014, 10:28 AM

## 2014-02-08 NOTE — Progress Notes (Signed)
Patient does not want to wear CPAP.  Patient aware to call RN or RT if he changes his mind.

## 2014-02-08 NOTE — Progress Notes (Signed)
CMT notified that pt HR has dropped several times and some pauses. Pt was found to be sleep at these times and once stimulated hr increases. Will continue to monitor. Marc SkillVeronica Calysta Craigo LPN

## 2014-02-09 LAB — CULTURE, BLOOD (ROUTINE X 2)
CULTURE: NO GROWTH
Culture: NO GROWTH

## 2014-11-02 ENCOUNTER — Ambulatory Visit: Payer: Medicare Other | Admitting: Cardiology

## 2016-01-30 ENCOUNTER — Encounter: Payer: Self-pay | Admitting: Podiatry

## 2016-01-30 ENCOUNTER — Ambulatory Visit (INDEPENDENT_AMBULATORY_CARE_PROVIDER_SITE_OTHER): Payer: Medicare Other | Admitting: Podiatry

## 2016-01-30 ENCOUNTER — Ambulatory Visit (INDEPENDENT_AMBULATORY_CARE_PROVIDER_SITE_OTHER): Payer: Medicare Other

## 2016-01-30 VITALS — BP 155/103 | HR 92

## 2016-01-30 DIAGNOSIS — M778 Other enthesopathies, not elsewhere classified: Secondary | ICD-10-CM

## 2016-01-30 DIAGNOSIS — M76821 Posterior tibial tendinitis, right leg: Secondary | ICD-10-CM | POA: Diagnosis not present

## 2016-01-30 DIAGNOSIS — M79671 Pain in right foot: Secondary | ICD-10-CM

## 2016-01-30 DIAGNOSIS — M79673 Pain in unspecified foot: Secondary | ICD-10-CM

## 2016-01-30 DIAGNOSIS — R52 Pain, unspecified: Secondary | ICD-10-CM

## 2016-01-30 DIAGNOSIS — M7751 Other enthesopathy of right foot: Secondary | ICD-10-CM | POA: Diagnosis not present

## 2016-01-30 DIAGNOSIS — M779 Enthesopathy, unspecified: Secondary | ICD-10-CM

## 2016-01-30 MED ORDER — MELOXICAM 15 MG PO TABS: 15.0000 mg | ORAL_TABLET | Freq: Every day | ORAL | 1 refills | Status: AC

## 2016-02-10 MED ORDER — BETAMETHASONE SOD PHOS & ACET 6 (3-3) MG/ML IJ SUSP: 3.0000 mg | Freq: Once | INTRAMUSCULAR | Status: AC

## 2016-02-10 NOTE — Progress Notes (Signed)
Subjective:  Patient presents today as a new patient for evaluation of bilateral foot pain. Patient is more symptomatic on the right lower extremity the left. Patient states that he is a diabetic. Patient states the pains been ongoing for several weeks. Patient presents today for further treatment and evaluation    Objective/Physical Exam General: The patient is alert and oriented x3 in no acute distress.  Dermatology: Skin is warm, dry and supple bilateral lower extremities. Negative for open lesions or macerations.  Vascular: Palpable pedal pulses bilaterally. No edema or erythema noted. Capillary refill within normal limits.  Neurological: Epicritic and protective threshold grossly intact bilaterally.   Musculoskeletal Exam: Pain on palpation noted to the lateral aspect of the patient's right midfoot. Pain on palpation also noted to the posterior tibial tendon of the right lower extremity. Pain with forced inversion of the right lower extremity. Range of motion within normal limits to all pedal and ankle joints bilateral. Muscle strength 5/5 in all groups bilateral.   Radiographic Exam:  Normal osseous mineralization. Joint spaces preserved. No fracture/dislocation/boney destruction.    Assessment: #1 right lateral midfoot pain - tarsal metatarsal joint #2 posterior tibial tendon dysfunction right  #3 pain in right foot and ankle   Plan of Care:  #1 Patient was evaluated. #2 injection of 0.5 mL Celestone Soluspan injected into the tarsometatarsal joint of the patient's right lateral midfoot #3 injection of 0.5 mL Celestone Soluspan injected in the patient's posterior tibial tendon sheath right at the level of the posterior medial malleolus #4 prescription for meloxicam 15 mg #5 return to clinic in 4 weeks   Dr. Felecia ShellingBrent M. Eliott Amparan, [name] [facility]

## 2016-02-27 ENCOUNTER — Ambulatory Visit (INDEPENDENT_AMBULATORY_CARE_PROVIDER_SITE_OTHER): Payer: Medicare Other | Admitting: Podiatry

## 2016-02-27 DIAGNOSIS — M779 Enthesopathy, unspecified: Secondary | ICD-10-CM

## 2016-02-27 DIAGNOSIS — M79671 Pain in right foot: Secondary | ICD-10-CM

## 2016-02-27 DIAGNOSIS — R238 Other skin changes: Secondary | ICD-10-CM

## 2016-02-27 DIAGNOSIS — M7751 Other enthesopathy of right foot: Secondary | ICD-10-CM | POA: Diagnosis not present

## 2016-02-27 DIAGNOSIS — M76821 Posterior tibial tendinitis, right leg: Secondary | ICD-10-CM

## 2016-02-27 DIAGNOSIS — R202 Paresthesia of skin: Secondary | ICD-10-CM

## 2016-02-27 DIAGNOSIS — R52 Pain, unspecified: Secondary | ICD-10-CM | POA: Diagnosis not present

## 2016-02-27 DIAGNOSIS — M778 Other enthesopathies, not elsewhere classified: Secondary | ICD-10-CM

## 2016-02-27 MED ORDER — BETAMETHASONE SOD PHOS & ACET 6 (3-3) MG/ML IJ SUSP: 3.0000 mg | Freq: Once | INTRAMUSCULAR | Status: AC

## 2016-02-27 NOTE — Progress Notes (Signed)
Subjective:  Patient presents today for follow-up evaluation of bilateral foot pain. Patient states that he is 70% better since last visit. Patient is more symptomatic on the right lower extremity the left. Patient states that he is a diabetic. Patient states the pains been ongoing for several weeks. Patient presents today for further treatment and evaluation    Objective/Physical Exam General: The patient is alert and oriented x3 in no acute distress.  Dermatology: Skin is warm, dry and supple bilateral lower extremities. Negative for open lesions or macerations.  Vascular: Palpable pedal pulses bilaterally. No edema or erythema noted. Capillary refill within normal limits.  Neurological: Epicritic and protective threshold grossly intact bilaterally. Patient complains of paresthesias to the right great toe.  Musculoskeletal Exam: Pain on palpation noted to the lateral aspect of the patient's right midfoot. Pain on palpation also noted to the posterior tibial tendon of the right lower extremity. Pain with forced inversion of the right lower extremity. Range of motion within normal limits to all pedal and ankle joints bilateral. Muscle strength 5/5 in all groups bilateral.  Palpable nodules noted to the bilateral heels consistent with piezogenic papules.  Radiographic Exam:  Normal osseous mineralization. Joint spaces preserved. No fracture/dislocation/boney destruction.    Assessment: #1 right lateral midfoot pain - tarsal metatarsal joint #2 posterior tibial tendon dysfunction right  #3 pain in right foot and ankle #4 piezogenic papules bilateral heels #5 paresthesia right great toe   Plan of Care:  #1 Patient was evaluated. #2 injection of 0.5 mL Celestone Soluspan injected into the tarsometatarsal joint of the patient's right lateral midfoot #3 injection of 0.5 mL Celestone Soluspan injected in the patient's posterior tibial tendon sheath right at the level of the posterior medial  malleolus #4 prescription for meloxicam 15 mg #5 return to clinic when necessary  Dr. Felecia ShellingBrent M. Evans, [name] [facility]

## 2016-06-17 ENCOUNTER — Encounter (HOSPITAL_COMMUNITY): Payer: Self-pay

## 2016-06-17 ENCOUNTER — Emergency Department (HOSPITAL_COMMUNITY): Payer: Medicare Other

## 2016-06-17 ENCOUNTER — Emergency Department (HOSPITAL_COMMUNITY)
Admission: EM | Admit: 2016-06-17 | Discharge: 2016-06-17 | Disposition: A | Discharge status: Home / Self Care | Payer: Medicare Other | Attending: Emergency Medicine | Admitting: Emergency Medicine

## 2016-06-17 DIAGNOSIS — R072 Precordial pain: Secondary | ICD-10-CM | POA: Insufficient documentation

## 2016-06-17 DIAGNOSIS — Z79899 Other long term (current) drug therapy: Secondary | ICD-10-CM | POA: Insufficient documentation

## 2016-06-17 DIAGNOSIS — I10 Essential (primary) hypertension: Secondary | ICD-10-CM | POA: Diagnosis not present

## 2016-06-17 DIAGNOSIS — R079 Chest pain, unspecified: Secondary | ICD-10-CM

## 2016-06-17 DIAGNOSIS — F17210 Nicotine dependence, cigarettes, uncomplicated: Secondary | ICD-10-CM | POA: Diagnosis not present

## 2016-06-17 DIAGNOSIS — E119 Type 2 diabetes mellitus without complications: Secondary | ICD-10-CM | POA: Diagnosis not present

## 2016-06-17 DIAGNOSIS — R0602 Shortness of breath: Secondary | ICD-10-CM | POA: Diagnosis not present

## 2016-06-17 HISTORY — DX: Type 2 diabetes mellitus without complications: E11.9

## 2016-06-17 HISTORY — DX: Essential (primary) hypertension: I10

## 2016-06-17 LAB — BASIC METABOLIC PANEL
ANION GAP: 10 (ref 5–15)
BUN: 11 mg/dL (ref 6–20)
CALCIUM: 9 mg/dL (ref 8.9–10.3)
CO2: 24 mmol/L (ref 22–32)
Chloride: 102 mmol/L (ref 101–111)
Creatinine, Ser: 0.59 mg/dL — ABNORMAL LOW (ref 0.61–1.24)
GFR calc non Af Amer: >60 mL/min (ref >60)
GLUCOSE: 127 mg/dL — AB (ref 65–99)
POTASSIUM: 3.8 mmol/L (ref 3.5–5.1)
Sodium: 136 mmol/L (ref 135–145)

## 2016-06-17 LAB — CBC
HEMATOCRIT: 40 % (ref 39.0–52.0)
HEMOGLOBIN: 13.6 g/dL (ref 13.0–17.0)
MCH: 32 pg (ref 26.0–34.0)
MCHC: 34 g/dL (ref 30.0–36.0)
MCV: 94.1 fL (ref 78.0–100.0)
Platelets: 236 10*3/uL (ref 150–400)
RBC: 4.25 MIL/uL (ref 4.22–5.81)
RDW: 14.1 % (ref 11.5–15.5)
WBC: 13.5 10*3/uL — AB (ref 4.0–10.5)

## 2016-06-17 LAB — I-STAT TROPONIN, ED
TROPONIN I, POC: 0 ng/mL (ref 0.00–0.08)
Troponin i, poc: 0 ng/mL (ref 0.00–0.08)

## 2016-06-17 IMAGING — CR DG CHEST 2V
2 series · 2 of 2 positions shown · non-contrast
Comparison: [DATE] and [DATE] portable chest radiographs.

CLINICAL DATA: Cough, congestion and fever for 1 week. Chest pain
for 1.5 hours.

EXAM:
CHEST  2 VIEW

[chest pa]
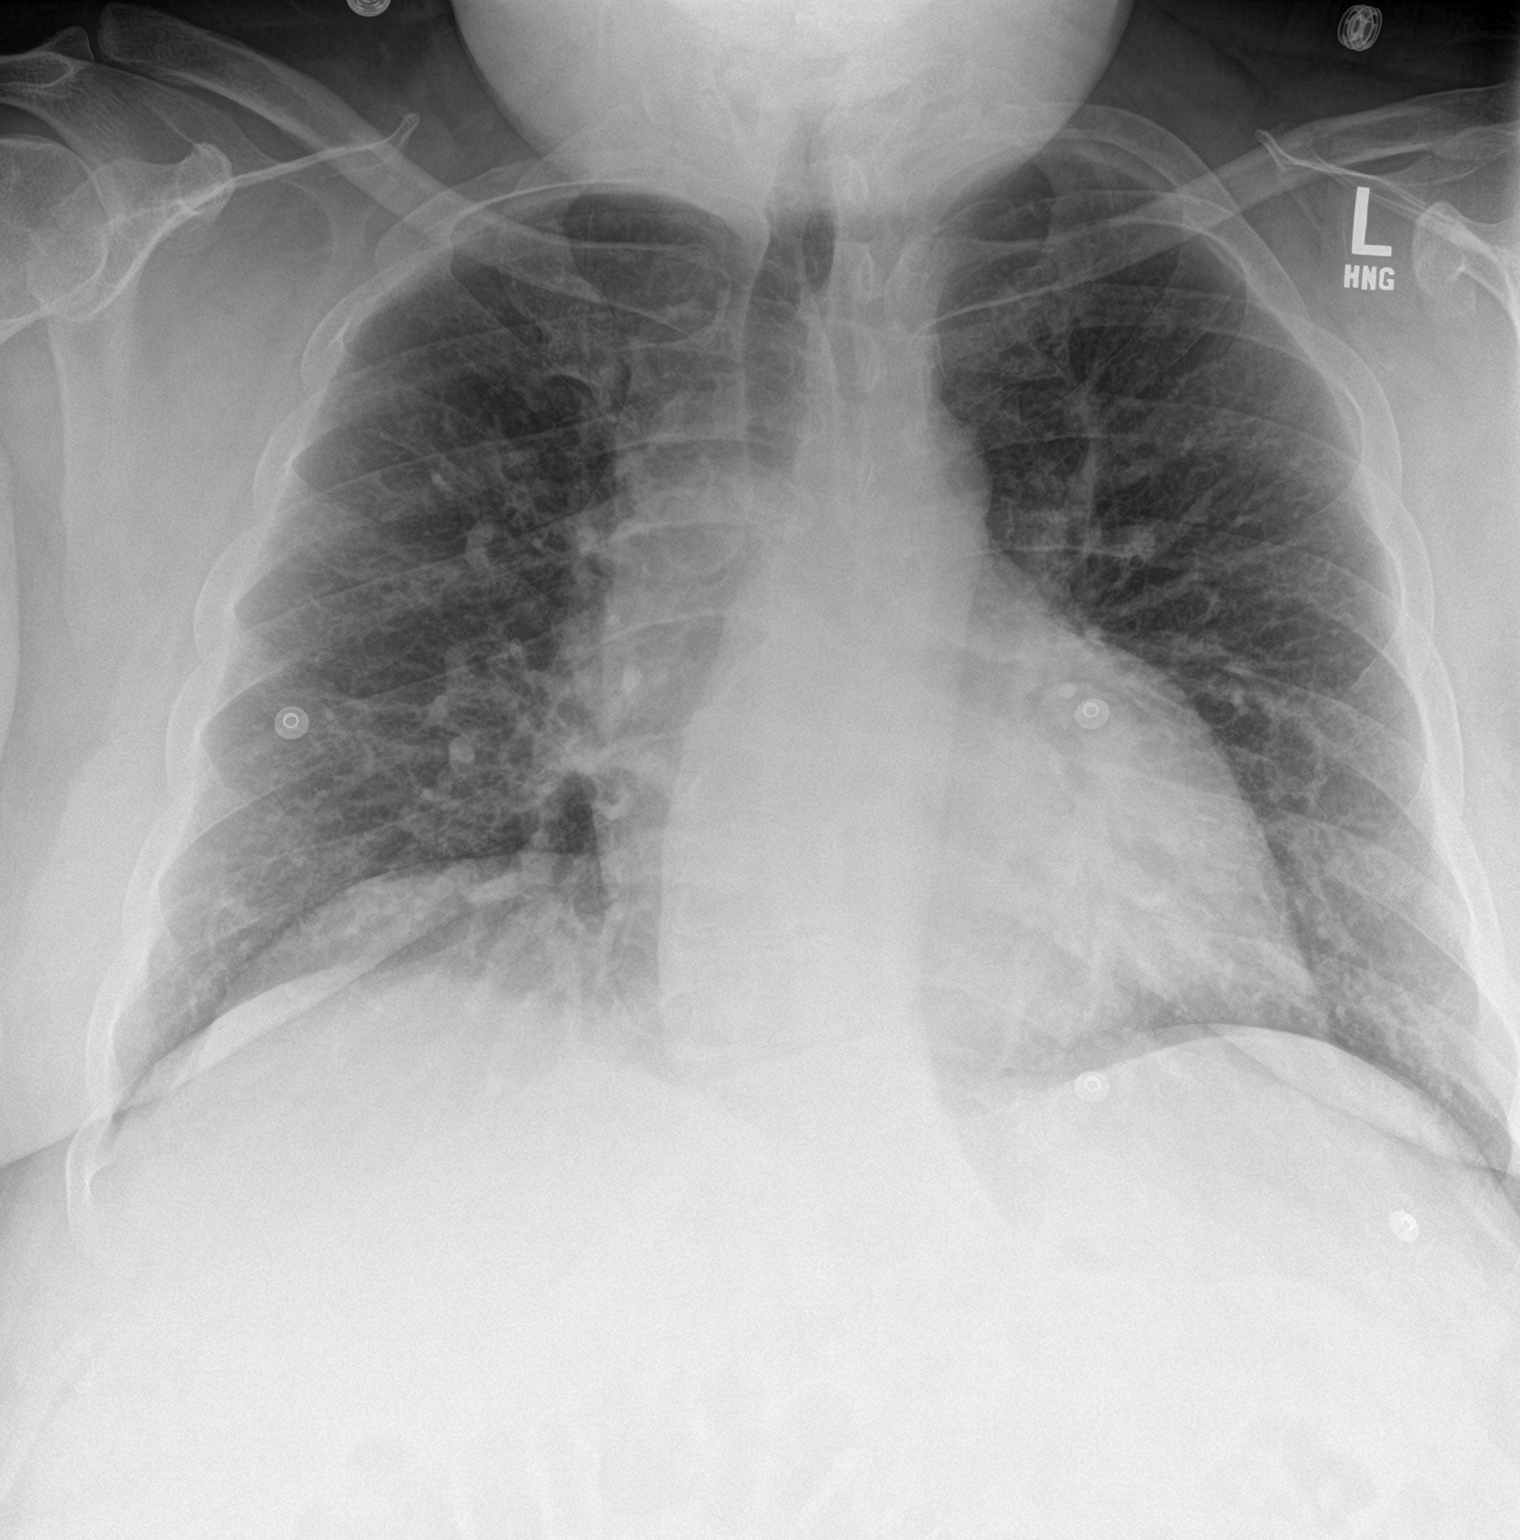

[chest lat]
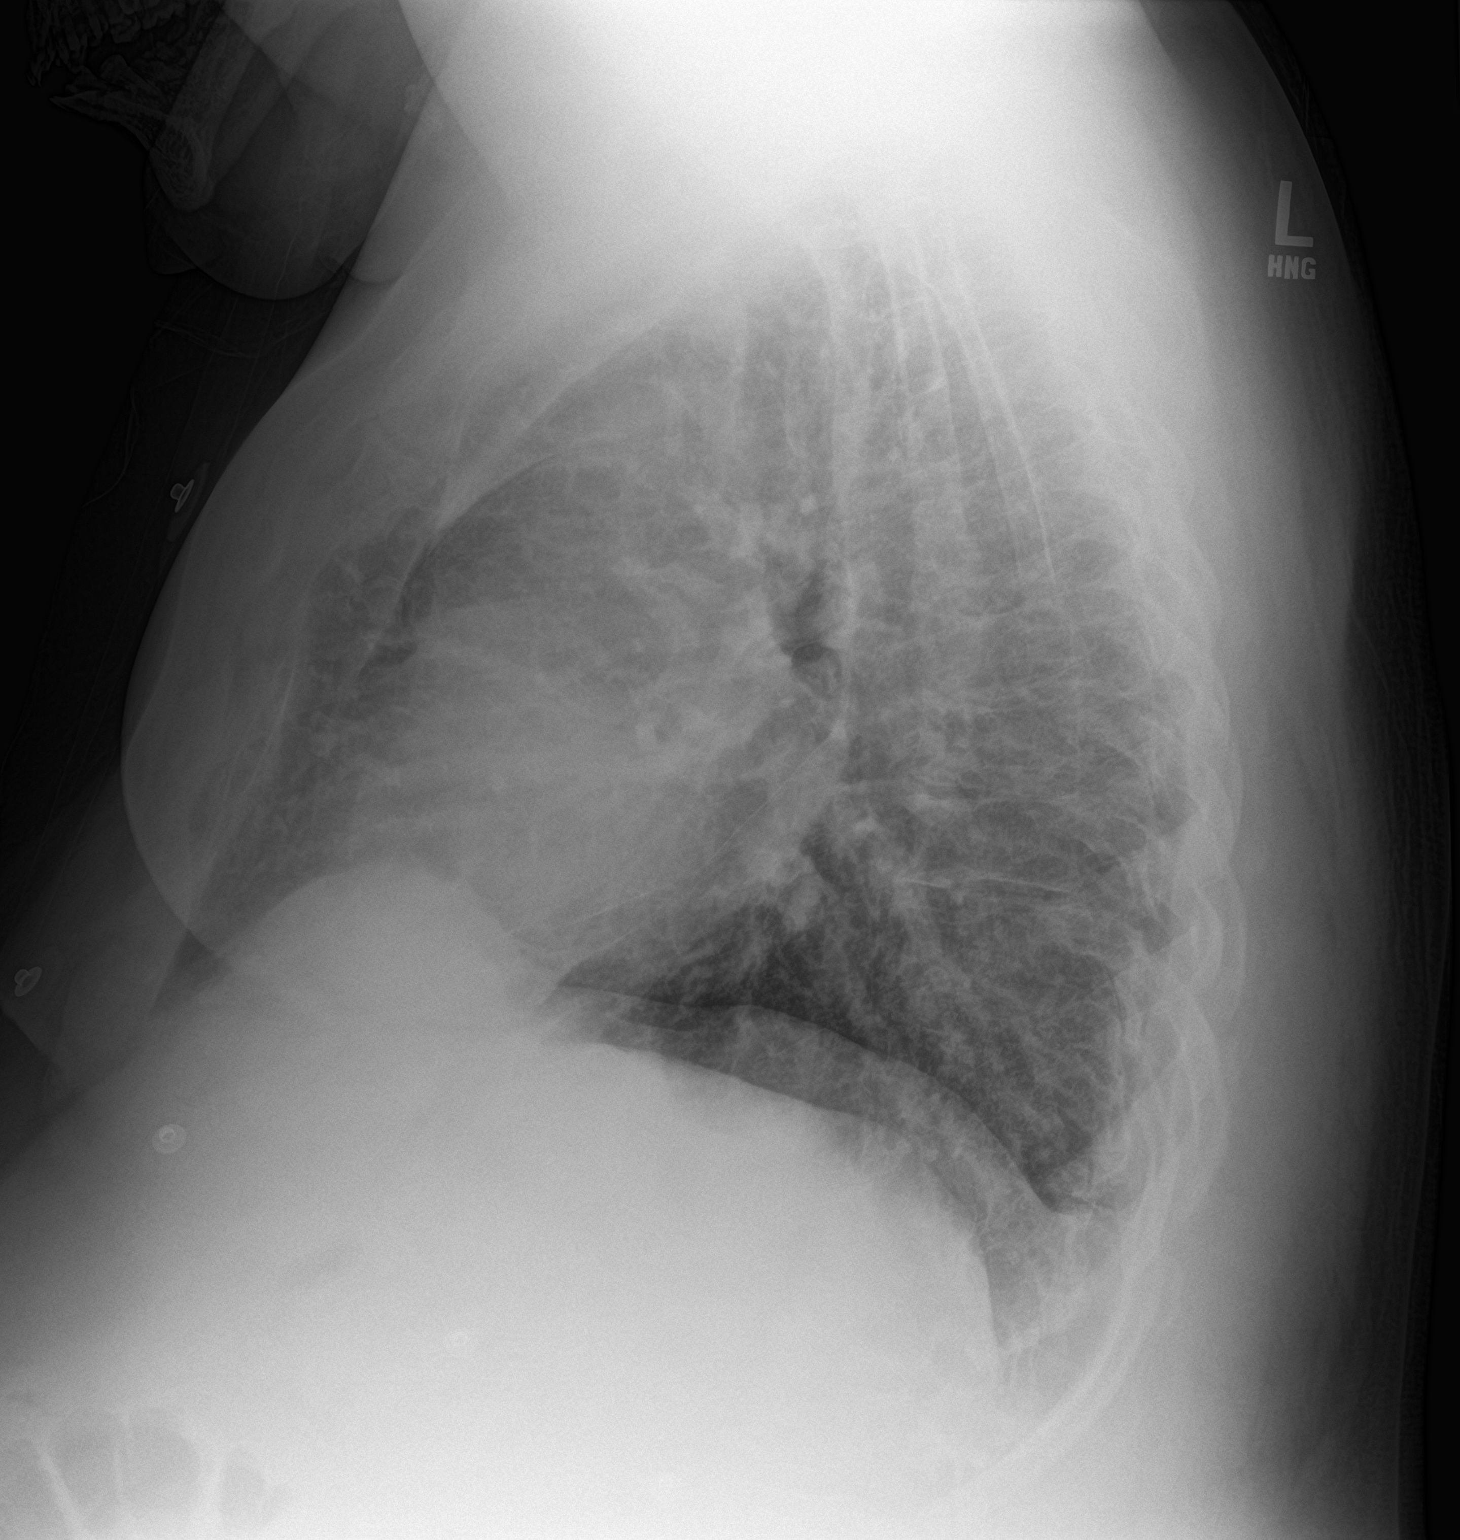

[2 of 2 positions shown; findings below may reference images not displayed]

FINDINGS: Stable mild cardiomegaly. The mediastinal contours are normal. There
is mild vascular congestion, although the airspace opacities
demonstrated previously have resolved. There is no pleural effusion
or pneumothorax. Mild thoracic spine degenerative changes are
present. There is a mild thoracolumbar scoliosis.
IMPRESSION: Cardiomegaly with mild vascular congestion. The airspace opacities
noted previously have resolved, and there are no current definite
signs of infection.

## 2016-06-17 NOTE — ED Provider Notes (Signed)
[facility]-EMERGENCY DEPT Provider Note   CSN: [id number] Arrival date & time: 06/17/16  1648     History   Chief Complaint Chief Complaint  Patient presents with  . Chest Pain    HPI MCKOY BHAKTA is a 41 y.o. male.  41 yo M with a cc of chest pain.  Walking in [employer] started to have substernal chest pain.  Lasted for about 5 min, mild lightheadedness.  Sat and rested and had his symptoms improve.  Denies n/v/diaphoresis.  Mild pain continues.     The history is provided by the patient.  Chest Pain   This is a new problem. The current episode started 1 to 2 hours ago. The problem occurs constantly. The problem has not changed since onset.The pain is associated with exertion. The pain is present in the substernal region. The pain is at a severity of 8/10. The pain is moderate. The quality of the pain is described as brief, burning and exertional. The pain does not radiate. Duration of episode(s) is 5 minutes. Associated symptoms include shortness of breath. Pertinent negatives include no abdominal pain, no fever, no headaches, no nausea, no palpitations and no vomiting. He has tried rest for the symptoms. The treatment provided mild relief. Risk factors include obesity, male gender and smoking/tobacco exposure.  His past medical history is significant for hyperlipidemia and hypertension.  Pertinent negatives for past medical history include no CHF, no MI and no PE.  His family medical history is significant for heart disease (chf).  Pertinent negatives for family medical history include: no CAD, no early MI and no PE.  Procedure history is negative for cardiac catheterization.    Past Medical History:  Diagnosis Date  . Diabetes mellitus without complication (HCC)   . Hypertension   . Obesity     Patient Active Problem List   Diagnosis Date Noted  . CAP (community acquired pneumonia) 02/07/2014  . Sepsis (HCC) 02/07/2014  . Leukocytosis 02/07/2014  . Acute respiratory failure  (HCC) 02/03/2014  . Blood poisoning (HCC)     Past Surgical History:  Procedure Laterality Date  . NOSE SURGERY         Home Medications    Prior to Admission medications   Medication Sig Start Date End Date Taking? Authorizing Provider  acetaminophen (TYLENOL) 500 MG tablet Take 1,000 mg by mouth every 6 (six) hours as needed.    Historical Provider, MD  INVOKANA 100 MG TABS tablet  12/15/15   Historical Provider, MD  levofloxacin (LEVAQUIN) 750 MG tablet Take 1 tablet (750 mg total) by mouth daily. Patient not taking: Reported on 01/30/2016 02/08/14   Marinda Elk, MD  Phenyleph-CPM-DM-Aspirin [facility] PLUS COLD & COUGH PO) Take 2 tablets by mouth 2 (two) times daily as needed (for cold symptoms).    Historical Provider, MD  rosuvastatin (CRESTOR) 20 MG tablet  12/15/15   Historical Provider, MD    Family History History reviewed. No pertinent family history.  Social History Social History  Substance Use Topics  . Smoking status: Current Every Day Smoker    Packs/day: 1.00    Years: 25.00    Types: Cigarettes  . Smokeless tobacco: Never Used  . Alcohol use No     Allergies   Patient has no known allergies.   Review of Systems Review of Systems  Constitutional: Negative for chills and fever.  HENT: Negative for congestion and facial swelling.   Eyes: Negative for discharge and visual disturbance.  Respiratory: Positive for  shortness of breath.   Cardiovascular: Positive for chest pain. Negative for palpitations.  Gastrointestinal: Negative for abdominal pain, diarrhea, nausea and vomiting.  Musculoskeletal: Negative for arthralgias and myalgias.  Skin: Negative for color change and rash.  Neurological: Negative for tremors, syncope and headaches.  Psychiatric/Behavioral: Negative for confusion and dysphoric mood.     Physical Exam Updated Vital Signs BP 121/77   Pulse 87   Temp 99.7 F (37.6 C) (Oral)   Resp 14   Ht  (1.651 m)   Wt 290  lb (131.5 kg)   SpO2 95%   BMI 48.26 kg/m   Physical Exam  Constitutional: He is oriented to person, place, and time. He appears well-developed and well-nourished.  Obese  HENT:  Head: Normocephalic and atraumatic.  Eyes: EOM are normal. Pupils are equal, round, and reactive to light.  Neck: Normal range of motion. Neck supple. No JVD present.  Cardiovascular: Normal rate and regular rhythm.  Exam reveals no gallop and no friction rub.   No murmur heard. Pulmonary/Chest: No respiratory distress. He has no wheezes. He exhibits tenderness (reproduces pain).  Abdominal: He exhibits no distension and no mass. There is no tenderness. There is no rebound and no guarding.  Musculoskeletal: Normal range of motion.  Neurological: He is alert and oriented to person, place, and time.  Skin: No rash noted. No pallor.  Psychiatric: He has a normal mood and affect. His behavior is normal.  Nursing note and vitals reviewed.    ED Treatments / Results  Labs (all labs ordered are listed, but only abnormal results are displayed) Labs Reviewed  BASIC METABOLIC PANEL - Abnormal; Notable for the following:       Result Value   Glucose, Bld 127 (*)    Creatinine, Ser 0.59 (*)    All other components within normal limits  CBC - Abnormal; Notable for the following:    WBC 13.5 (*)    All other components within normal limits  I-STAT TROPOININ, ED  I-STAT TROPOININ, ED    EKG  EKG Interpretation  Date/Time:  Tuesday June 17 2016 16:55:02 EDT Ventricular Rate:  96 PR Interval:    QRS Duration: 100 QT Interval:  347 QTC Calculation: 439 R Axis:   22 Text Interpretation:  Sinus rhythm Baseline wander in lead(s) V1 V2 No significant change since last tracing Confirmed by Aundraya Dripps MD, DANIEL [phone]) on 06/17/2016 5:19:04 PM       Radiology Dg Chest 2 View  Result Date: 06/17/2016 CLINICAL DATA:  Cough, congestion and fever for 1 week. Chest pain for 1.5 hours. EXAM: CHEST  2 VIEW COMPARISON:   02/04/2014 and 02/03/2014 portable chest radiographs. FINDINGS: Stable mild cardiomegaly. The mediastinal contours are normal. There is mild vascular congestion, although the airspace opacities demonstrated previously have resolved. There is no pleural effusion or pneumothorax. Mild thoracic spine degenerative changes are present. There is a mild thoracolumbar scoliosis. IMPRESSION: Cardiomegaly with mild vascular congestion. The airspace opacities noted previously have resolved, and there are no current definite signs of infection. Electronically Signed   By: Carey Bullocks M.D.   On: 06/17/2016 17:47    Procedures Procedures (including critical care time)  Medications Ordered in ED Medications - No data to display   Initial Impression / Assessment and Plan / ED Course  I have reviewed the triage vital signs and the nursing notes.  Pertinent labs & imaging results that were available during my care of the patient were reviewed by me and  considered in my medical decision making (see chart for details).     41 yo M with a cc of chest pain, brief, resolved. Symptoms are reproduced with palpation of the chest. Patient however has concerning risk factors including morbid obesity diabetes hypertension hyperlipidemia and smoking. I discussed with the patient that he likely needs to try and limit these as best possible. I discussed smoking cessation. Delta trop.   Discussed smoking cessation with patient and was they were offerred resources to help stop.  Total time was 5 min CPT code 16109.    Delta negative, suspect musculoskeletal.  D/c home.   10:37 PM:  I have discussed the diagnosis/risks/treatment options with the patient and family and believe the pt to be eligible for discharge home to follow-up with PCP. We also discussed returning to the ED immediately if new or worsening sx occur. We discussed the sx which are most concerning (e.g., sudden worsening pain, fever, inability to tolerate by  mouth) that necessitate immediate return. Medications administered to the patient during their visit and any new prescriptions provided to the patient are listed below.  Medications given during this visit Medications - No data to display   The patient appears reasonably screen and/or stabilized for discharge and I doubt any other medical condition or other [facility] requiring further screening, evaluation, or treatment in the ED at this time prior to discharge.    Final Clinical Impressions(s) / ED Diagnoses   Final diagnoses:  Nonspecific chest pain    New Prescriptions Discharge Medication List as of 06/17/2016  9:39 PM       Melene Plan, DO 06/17/16 2237

## 2016-06-17 NOTE — ED Triage Notes (Signed)
Pt coming in by [facility]. Pt was a [employer] when he started to have sudden onset centralized sharp chest pain. Pt denies and radiation, nausea, and dizziness. Pt states he feels a little short of breath. When EMS arrived pain had subsided to a 2/10. Given 324 of ASA on transport.

## 2016-06-17 NOTE — Discharge Instructions (Signed)
Follow up with your PCP in a couple days.  Discuss about having a stress test if they think is needed.

## 2016-06-30 ENCOUNTER — Telehealth: Payer: Self-pay

## 2016-06-30 NOTE — Telephone Encounter (Signed)
SENT NOTES TO SCHEDULING 

## 2016-07-01 DIAGNOSIS — R079 Chest pain, unspecified: Secondary | ICD-10-CM | POA: Insufficient documentation

## 2016-07-01 DIAGNOSIS — F17200 Nicotine dependence, unspecified, uncomplicated: Secondary | ICD-10-CM | POA: Insufficient documentation

## 2016-07-01 DIAGNOSIS — F1010 Alcohol abuse, uncomplicated: Secondary | ICD-10-CM | POA: Insufficient documentation

## 2016-07-01 DIAGNOSIS — I519 Heart disease, unspecified: Secondary | ICD-10-CM | POA: Insufficient documentation

## 2016-07-01 DIAGNOSIS — I10 Essential (primary) hypertension: Secondary | ICD-10-CM | POA: Insufficient documentation

## 2016-07-01 DIAGNOSIS — E119 Type 2 diabetes mellitus without complications: Secondary | ICD-10-CM | POA: Insufficient documentation

## 2016-07-01 DIAGNOSIS — E669 Obesity, unspecified: Secondary | ICD-10-CM | POA: Insufficient documentation

## 2016-07-02 ENCOUNTER — Encounter (INDEPENDENT_AMBULATORY_CARE_PROVIDER_SITE_OTHER): Payer: Self-pay

## 2016-07-02 ENCOUNTER — Ambulatory Visit (INDEPENDENT_AMBULATORY_CARE_PROVIDER_SITE_OTHER): Payer: Medicare Other | Admitting: Cardiovascular Disease

## 2016-07-02 ENCOUNTER — Encounter: Payer: Self-pay | Admitting: Cardiovascular Disease

## 2016-07-02 VITALS — BP 156/94 | HR 84 | Ht 65.0 in | Wt 310.4 lb

## 2016-07-02 DIAGNOSIS — I10 Essential (primary) hypertension: Secondary | ICD-10-CM | POA: Diagnosis not present

## 2016-07-02 NOTE — Patient Instructions (Signed)
Medication Instructions:  Your physician recommends that you continue on your current medications as directed. Please refer to the Current Medication list given to you today.   Labwork: None Ordered   Testing/Procedures: None Ordered   Follow-Up: Your physician recommends that you schedule a follow-up appointment in: as needed with Dr. Nahser   If you need a refill on your cardiac medications before your next appointment, please call your pharmacy.   Thank you for choosing [facility]! Rekha Hobbins, RN [phone]    

## 2016-07-02 NOTE — Progress Notes (Signed)
Cardiology Office Note   Date:  07/02/2016   ID:  [name], DOB June 26, 1975, MRN [id number]  PCP:  Marc Reeve, MD  Cardiologist:   Marc Miss, MD   Chief Complaint  Patient presents with  . Chest Pain   Problem List 1. Chest pain 2. Morbid obesity  3. Obstructive sleep apnea - does not wear CPAP   History of Present Illness:  Marc Galloway is a 41 y.o. male who is being seen today for the evaluation of chest pain  at the request of [name], MD.  Hx of morbid obesity. Body mass index is 51.65 kg/m.  Hx of DM Presents with chest pain .  Had an episode of CP on April 3. Was at [employer] walking around  heaviness,  Lasted 3-4 minutes , did not radiate.  Associated with some increased dyspnea.  No diaphoresis EMS took him to the hospital , ECG was unremarkable ,  Troponin levels were negative x 2  Pain gradually resolved ,  Did not receive any SL NTG .   Does not get any regular exercise.  Sits around all day on the computer.  No CP doing household chores.  Has some dyspnea.   Has OSA , does not wear CPAP .   Falls asleep very easily during the day  Does not work - is on disability .  Smokes - advised him to stop smoking    Past Medical History:  Diagnosis Date  . Acute respiratory failure (HCC)   . Allergic rhinitis   . CAP (community acquired pneumonia)   . Cardiac disease   . CHF (congestive heart failure) (HCC)   . Depression   . Diabetes mellitus without complication (HCC)   . Disturbance, sleep   . ETOH abuse   . Hyperlipidemia   . Hypertension   . Leukocytosis   . Obesity   . OSA (obstructive sleep apnea)   . Sepsis (HCC)   . Smoker   . Tobacco abuse     Past Surgical History:  Procedure Laterality Date  . NOSE SURGERY       Current Outpatient Prescriptions  Medication Sig Dispense Refill  . acetaminophen (TYLENOL) 500 MG tablet Take 1,000 mg by mouth every 6 (six) hours as needed.    [name] amLODipine (NORVASC) 5 MG tablet  Take 5 mg by mouth daily.    [name] aspirin EC 81 MG tablet Take 81 mg by mouth daily.    [name] escitalopram (LEXAPRO) 10 MG tablet Take 10 mg by mouth daily.    . fluticasone (FLONASE) 50 MCG/ACT nasal spray Place 1 spray into both nostrils daily.    . hydrochlorothiazide (HYDRODIURIL) 12.5 MG tablet Take 12.5 mg by mouth daily.    [name] ibuprofen (ADVIL,MOTRIN) 200 MG tablet Take 200 mg by mouth every 6 (six) hours as needed.    . meloxicam (MOBIC) 15 MG tablet Take 15 mg by mouth daily.    . pioglitazone (ACTOS) 15 MG tablet Take 15 mg by mouth daily.    . rosuvastatin (CRESTOR) 20 MG tablet      Current Facility-Administered Medications  Medication Dose Route Frequency Provider Last Rate Last Dose  . betamethasone acetate-betamethasone sodium phosphate (CELESTONE) injection 3 mg  3 mg Intramuscular Once [name], [name]      . betamethasone acetate-betamethasone sodium phosphate (CELESTONE) injection 3 mg  3 mg Intramuscular Once [name], [name]        No flowsheet data found.  Allergies:   Patient has no known allergies.    Social History:  The patient  reports that he has been smoking Cigarettes.  He has a 25.00 pack-year smoking history. He has never used smokeless tobacco. He reports that he does not drink alcohol or use drugs.   Family History:  The patient's family history includes Heart failure in his mother; Hypertension in his father and mother.    ROS:  Please see the history of present illness.    Review of Systems: Constitutional:  denies fever, chills, diaphoresis, appetite change and fatigue.  HEENT: denies photophobia, eye pain, redness, hearing loss, ear pain, congestion, sore throat, rhinorrhea, sneezing, neck pain, neck stiffness and tinnitus.  Respiratory: denies SOB, DOE, cough, chest tightness, and wheezing.  Cardiovascular: admits to chest pain,   Gastrointestinal: denies nausea, vomiting, abdominal pain, diarrhea, constipation, blood in stool.  Genitourinary:  denies dysuria, urgency, frequency, hematuria, flank pain and difficulty urinating.  Musculoskeletal: denies  myalgias, back pain, joint swelling, arthralgias and gait problem.   Skin: denies pallor, rash and wound.  Neurological: denies dizziness, seizures, syncope, weakness, light-headedness, numbness and headaches.   Hematological: denies adenopathy, easy bruising, personal or family bleeding history.  Psychiatric/ Behavioral: denies suicidal ideation, mood changes, confusion, nervousness, sleep disturbance and agitation.       All other systems are reviewed and negative.    PHYSICAL EXAM: VS:  BP (!) 156/94   Pulse 84   Ht  (1.651 m)   Wt (!) 310 lb 6.4 oz (140.8 kg)   SpO2 96%   BMI 51.65 kg/m  , BMI Body mass index is 51.65 kg/m. GEN:  Morbidly obese male.   HEENT: thick neck Neck: unable to assess  JVD, carotid bruits, or masses Cardiac: RRR; no murmurs, rubs, or gallops,no edema  Respiratory:  clear to auscultation bilaterally, normal work of breathing GI: soft, nontender, nondistended, + BS MS: no deformity or atrophy  Skin: warm and dry, no rash Neuro:  Strength and sensation are intact Psych: normal   EKG:  EKG is not ordered today.    Recent Labs: 06/17/2016: BUN 11; Creatinine, Ser 0.59; Hemoglobin 13.6; Platelets 236; Potassium 3.8; Sodium 136    Lipid Panel No results found for: CHOL, TRIG, HDL, CHOLHDL, VLDL, LDLCALC, LDLDIRECT    Wt Readings from Last 3 Encounters:  07/02/16 (!) 310 lb 6.4 oz (140.8 kg)  06/17/16 290 lb (131.5 kg)  02/06/14 283 lb 3.2 oz (128.5 kg)      Other studies Reviewed: Additional studies/ records that were reviewed today include:  Review of the above records demonstrates:    ASSESSMENT AND PLAN:  1.  Atypical CP:   Pt presents with a single episode of atypical CP.    It occurred while he was walking around in [place].  He was evaluated in the emergency room. His EKG was nonacute. His troponins were negative  2.  At present I do not think that we should do any further evaluation. He's morbidly obese- Body mass index is 51.65 kg/m.  A Lexiscan Myoview study would almost certainly have attenuation artifact and I think would be very difficult to interpret.  It is clear that Samit has not been doing much to care for himself. He has diabetes mellitus but he is morbidly obese. He continues to smoke cigarettes. He does not get any exercise.   He has known obstructive sleep apnea but does not wear a CPAP mask.   He eats fast foods every day/all-day.  I've recommended that he start to work on a better diet and exercise program. I've recommended that he stop smoking immediately.  I've are moderate him that he has many challenges that are beyond her control and that he is the only person who can improve his situation.  I have recommended he start a regular exercise program and to gradually increase his exercise every week. He'll call us back if he has any further episodes of chest pain.  We'll see him on an as-needed basis.     Current medicines are reviewed at length with the patient today.  The patient does not have concerns regarding medicines.  Labs/ tests ordered today include:  No orders of the defined types were placed in this encounter.   Disposition:   FU with me as needed.      Marc Miss, MD  07/02/2016 1:37 PM    [facility] [address] [place], [place], [place]  [address] Phone: [phone]; Fax: [phone]

## 2017-01-07 ENCOUNTER — Ambulatory Visit (INDEPENDENT_AMBULATORY_CARE_PROVIDER_SITE_OTHER): Payer: Medicare Other | Admitting: Podiatry

## 2017-01-07 ENCOUNTER — Encounter: Payer: Self-pay | Admitting: Podiatry

## 2017-01-07 ENCOUNTER — Ambulatory Visit (INDEPENDENT_AMBULATORY_CARE_PROVIDER_SITE_OTHER): Payer: Medicare Other

## 2017-01-07 DIAGNOSIS — M659 Synovitis and tenosynovitis, unspecified: Secondary | ICD-10-CM

## 2017-01-07 DIAGNOSIS — R609 Edema, unspecified: Secondary | ICD-10-CM

## 2017-01-07 MED ORDER — MELOXICAM 15 MG PO TABS: 15.0000 mg | ORAL_TABLET | Freq: Every day | ORAL | 2 refills | Status: DC

## 2017-01-07 MED ORDER — BETAMETHASONE SOD PHOS & ACET 6 (3-3) MG/ML IJ SUSP: 3.0000 mg | Freq: Once | INTRAMUSCULAR | Status: AC

## 2017-01-10 NOTE — Progress Notes (Signed)
    Subjective:  Patient presents today for follow-up evaluation of bilateral foot pain. He states his right foot has improved significantly and is doing well. He states the left foot/ankle still has throbbing. He reports associated swelling. Pt states he was painting one week ago and it has exacerbated the pain. Resting the left foot/ankle without standing or ambulation helps alleviate the symptoms. He is here for further evaluation and treatment.    Past Medical History:  Diagnosis Date  . Acute respiratory failure (HCC)   . Allergic rhinitis   . CAP (community acquired pneumonia)   . Cardiac disease   . CHF (congestive heart failure) (HCC)   . Depression   . Diabetes mellitus without complication (HCC)   . Disturbance, sleep   . ETOH abuse   . Hyperlipidemia   . Hypertension   . Leukocytosis   . Obesity   . OSA (obstructive sleep apnea)   . Sepsis (HCC)   . Smoker   . Tobacco abuse       Objective/Physical Exam General: The patient is alert and oriented x3 in no acute distress.  Dermatology: Skin is warm, dry and supple bilateral lower extremities. Negative for open lesions or macerations.  Vascular: Palpable pedal pulses bilaterally. No edema or erythema noted. Capillary refill within normal limits.  Neurological: Epicritic and protective threshold grossly intact bilaterally. Patient complains of paresthesias to the right great toe.  Musculoskeletal Exam: Pain with palpation to the left ankle. Range of motion within normal limits to all pedal and ankle joints bilateral. Muscle strength 5/5 in all groups bilateral.    Radiographic Exam:  Normal osseous mineralization. Joint spaces preserved. No fracture/dislocation/boney destruction.    Assessment: #1 left ankle synovitis    Plan of Care:  #1 Patient was evaluated. X-Rays reviewed. #2 Injection of 0.5 mLs Celestone Soluspan injected into the left ankle joint.  #3 left ankle brace dispensed. #4 Return to clinic  when necessary.   Felecia ShellingBrent M. Evans, [name] [facility]  Dr. Felecia ShellingBrent M. Evans, [name]    [address]                                    [place], [place] [address]                Office [phone]  Fax [phone]

## 2017-10-28 ENCOUNTER — Ambulatory Visit (INDEPENDENT_AMBULATORY_CARE_PROVIDER_SITE_OTHER): Payer: Medicare Other | Admitting: Podiatry

## 2017-10-28 ENCOUNTER — Ambulatory Visit: Payer: Self-pay

## 2017-10-28 DIAGNOSIS — M659 Synovitis and tenosynovitis, unspecified: Secondary | ICD-10-CM

## 2017-10-28 DIAGNOSIS — M7672 Peroneal tendinitis, left leg: Secondary | ICD-10-CM | POA: Diagnosis not present

## 2017-10-30 NOTE — Progress Notes (Signed)
    Subjective:  42 year old male presenting today for follow up evaluation of left ankle pain. He reports associated swelling. He states the injection he received 10 months ago at his last appointment. Walking and bearing weight increases the pain. Patient is here for further evaluation and treatment.   Past Medical History:  Diagnosis Date  . Acute respiratory failure (HCC)   . Allergic rhinitis   . CAP (community acquired pneumonia)   . Cardiac disease   . CHF (congestive heart failure) (HCC)   . Depression   . Diabetes mellitus without complication (HCC)   . Disturbance, sleep   . ETOH abuse   . Hyperlipidemia   . Hypertension   . Leukocytosis   . Obesity   . OSA (obstructive sleep apnea)   . Sepsis (HCC)   . Smoker   . Tobacco abuse       Objective/Physical Exam General: The patient is alert and oriented x3 in no acute distress.  Dermatology: Skin is warm, dry and supple bilateral lower extremities. Negative for open lesions or macerations.  Vascular: Palpable pedal pulses bilaterally. No edema or erythema noted. Capillary refill within normal limits.  Neurological: Epicritic and protective threshold grossly intact bilaterally. Patient complains of paresthesias to the right great toe.  Musculoskeletal Exam: Pain with palpation to the left ankle and peroneal tendon of the left foot. Range of motion within normal limits to all pedal and ankle joints bilateral. Muscle strength 5/5 in all groups bilateral.    Radiographic Exam:  Normal osseous mineralization. Joint spaces preserved. No fracture/dislocation/boney destruction.    Assessment: #1 left ankle synovitis/DJD #2 peroneal tendinitis left    Plan of Care:  #1 Patient was evaluated. X-Rays reviewed. #2 Injection of 0.5 mLs Celestone Soluspan injected into the peroneal tendon sheath of the left foot.  #3 Continue wearing left ankle brace #4 Prescription for Diclofenac provided to patient. #5 Return to clinic  when necessary.   Felecia ShellingBrent M. Evans, [name] [facility]  Dr. Felecia ShellingBrent M. Evans, [name]    [address]                                    [place], [place] [address]                Office [phone]  Fax [phone]

## 2018-02-03 ENCOUNTER — Telehealth: Payer: Self-pay | Admitting: *Deleted

## 2018-02-03 MED ORDER — MELOXICAM 15 MG PO TABS: 15.0000 mg | ORAL_TABLET | Freq: Every day | ORAL | 0 refills | Status: DC

## 2018-02-03 NOTE — Telephone Encounter (Signed)
-----   Message from Ree Edmanachel A Komonski sent at 02/03/2018 10:44 AM EST ----- Regarding: Refill rx  Contact: [phone] Pt walked in and is requesting refill of meloxicam. Patient has appointment coming up on 03/01/2018 with Dr. Logan BoresEvans. [facility]..please advise.

## 2018-02-03 NOTE — Telephone Encounter (Signed)
I informed pt of Dr. Logan BoresEvans refill of the Meloxicam.

## 2018-02-03 NOTE — Telephone Encounter (Signed)
Dr. Philomena DohenyEvans okayed refills of the meloxicam.

## 2018-03-01 ENCOUNTER — Ambulatory Visit (INDEPENDENT_AMBULATORY_CARE_PROVIDER_SITE_OTHER): Payer: Medicare Other | Admitting: Podiatry

## 2018-03-01 DIAGNOSIS — M659 Synovitis and tenosynovitis, unspecified: Secondary | ICD-10-CM

## 2018-03-01 DIAGNOSIS — R234 Changes in skin texture: Secondary | ICD-10-CM

## 2018-03-04 NOTE — Progress Notes (Signed)
    Subjective:  42 year old male presenting today for follow up evaluation of left foot and ankle pain. He states the left foot pain has resolved but the left ankle is still bothersome. He has been taking Diclofenac and wearing an ankle brace for treatment. There are no modifying factors noted.  He also has a new complaint of a painful callus area on the medial posterior left heel that has been present for the past few months. He has been applying OTC cream with no significant relief. Walking on the foot increases the pain. Patient is here for further evaluation and treatment.   Past Medical History:  Diagnosis Date  . Acute respiratory failure (HCC)   . Allergic rhinitis   . CAP (community acquired pneumonia)   . Cardiac disease   . CHF (congestive heart failure) (HCC)   . Depression   . Diabetes mellitus without complication (HCC)   . Disturbance, sleep   . ETOH abuse   . Hyperlipidemia   . Hypertension   . Leukocytosis   . Obesity   . OSA (obstructive sleep apnea)   . Sepsis (HCC)   . Smoker   . Tobacco abuse       Objective/Physical Exam General: The patient is alert and oriented x3 in no acute distress.  Dermatology: Skin fissure noted to the medial left heel. Skin is warm, dry and supple bilateral lower extremities.   Vascular: Palpable pedal pulses bilaterally. No edema or erythema noted. Capillary refill within normal limits.  Neurological: Epicritic and protective threshold grossly intact bilaterally. Patient complains of paresthesias to the right great toe.  Musculoskeletal Exam: Pain with palpation to the medial, lateral and anterior aspects of the left ankle joint. Range of motion within normal limits to all pedal and ankle joints bilateral. Muscle strength 5/5 in all groups bilateral.   Assessment: #1 left ankle synovitis/DJD #2 peroneal tendinitis left - resolved  #3 skin fissure left medial heel   Plan of Care:  #1 Patient was evaluated.  #2 Injection  of 0.5 mLs Celestone Soluspan injected into the left ankle joint.  #3 Refill prescription for Diclofenac provided to patient.  #4 Continue using ankle brace.  #5 Recommended good foot lotion daily.  #6 Return to clinic as needed.    Felecia ShellingBrent M. , [name] [facility]  Dr. Felecia ShellingBrent M. , [name]    [address]                                    [place], [place] [address]                Office [phone]  Fax [phone]

## 2018-03-22 ENCOUNTER — Other Ambulatory Visit: Payer: Self-pay

## 2018-03-22 MED ORDER — MELOXICAM 15 MG PO TABS: 15.0000 mg | ORAL_TABLET | Freq: Every day | ORAL | 0 refills | Status: DC

## 2018-03-22 NOTE — Progress Notes (Signed)
Refill on RX meloxicam 15mg  30tab no refills

## 2018-09-06 ENCOUNTER — Ambulatory Visit: Payer: Medicare Other

## 2018-09-06 ENCOUNTER — Ambulatory Visit (INDEPENDENT_AMBULATORY_CARE_PROVIDER_SITE_OTHER): Payer: Medicare Other

## 2018-09-06 ENCOUNTER — Other Ambulatory Visit: Payer: Self-pay

## 2018-09-06 ENCOUNTER — Other Ambulatory Visit: Payer: Self-pay | Admitting: Podiatry

## 2018-09-06 ENCOUNTER — Encounter: Payer: Self-pay | Admitting: Podiatry

## 2018-09-06 ENCOUNTER — Ambulatory Visit (INDEPENDENT_AMBULATORY_CARE_PROVIDER_SITE_OTHER): Payer: Medicare Other | Admitting: Podiatry

## 2018-09-06 VITALS — Temp 97.1°F

## 2018-09-06 DIAGNOSIS — M659 Synovitis and tenosynovitis, unspecified: Secondary | ICD-10-CM

## 2018-09-06 DIAGNOSIS — M7672 Peroneal tendinitis, left leg: Secondary | ICD-10-CM

## 2018-09-06 DIAGNOSIS — M65972 Unspecified synovitis and tenosynovitis, left ankle and foot: Secondary | ICD-10-CM

## 2018-09-06 DIAGNOSIS — E0843 Diabetes mellitus due to underlying condition with diabetic autonomic (poly)neuropathy: Secondary | ICD-10-CM

## 2018-09-06 DIAGNOSIS — M19072 Primary osteoarthritis, left ankle and foot: Secondary | ICD-10-CM

## 2018-09-06 MED ORDER — DICLOFENAC SODIUM 75 MG PO TBEC: 75.0000 mg | DELAYED_RELEASE_TABLET | Freq: Two times a day (BID) | ORAL | 3 refills | Status: DC

## 2018-09-08 NOTE — Progress Notes (Signed)
    Subjective:  43 year old male presenting today for follow up evaluation of left foot and ankle pain. He states the pain had resolved while he was taking Diclofenac. Once her ran out of Diclofenac the pain returned. Walking for long periods of time or standing increases the pain. Patient is here for further evaluation and treatment.   Past Medical History:  Diagnosis Date  . Acute respiratory failure ([place])   . Allergic rhinitis   . CAP (community acquired pneumonia)   . Cardiac disease   . CHF (congestive heart failure) ([place])   . Depression   . Diabetes mellitus without complication ([place])   . Disturbance, sleep   . ETOH abuse   . Hyperlipidemia   . Hypertension   . Leukocytosis   . Obesity   . OSA (obstructive sleep apnea)   . Sepsis ([place])   . Smoker   . Tobacco abuse       Objective/Physical Exam General: The patient is alert and oriented x3 in no acute distress.  Dermatology: Skin fissure noted to the medial left heel. Skin is warm, dry and supple bilateral lower extremities.   Vascular: Palpable pedal pulses bilaterally. No edema or erythema noted. Capillary refill within normal limits.  Neurological: Epicritic and protective threshold grossly intact bilaterally. Patient complains of paresthesias to the right great toe.  Musculoskeletal Exam: Pain with palpation to the medial, lateral and anterior aspects of the left ankle joint as well as the peroneal tendon of the left foot. Range of motion within normal limits to all pedal and ankle joints bilateral. Muscle strength 5/5 in all groups bilateral.   Radiographic Exam: DJD noted to left ankle and pedal joints.   Assessment: #1 left ankle synovitis/DJD #2 peroneal tendinitis left  #3 skin fissure left medial heel #4 T2DM    Plan of Care:  #1 Patient was evaluated. X-Rays reviewed.  #2 Injection of 0.5 mLs Celestone Soluspan injected into the left ankle joint.  #3 Refill prescription for Diclofenac provided to  patient.  #4 Continue using ankle brace.  #5 Continue using daily foot lotion.  #6 Appointment with Liliane Channel, Pedorthist for DM shoes and insoles.  #7 Return to clinic as needed.    Edrick Kins, [name] [facility]  Dr. Edrick Kins, [name]    [address] [place], [place] [address]                Office [phone]  Fax [phone]

## 2018-09-14 ENCOUNTER — Other Ambulatory Visit: Payer: Self-pay

## 2018-09-14 ENCOUNTER — Ambulatory Visit: Payer: Medicare Other | Admitting: Orthotics

## 2018-09-14 DIAGNOSIS — M19072 Primary osteoarthritis, left ankle and foot: Secondary | ICD-10-CM

## 2018-09-14 DIAGNOSIS — E0843 Diabetes mellitus due to underlying condition with diabetic autonomic (poly)neuropathy: Secondary | ICD-10-CM

## 2018-09-14 DIAGNOSIS — M7672 Peroneal tendinitis, left leg: Secondary | ICD-10-CM

## 2018-09-14 DIAGNOSIS — R234 Changes in skin texture: Secondary | ICD-10-CM

## 2018-09-14 DIAGNOSIS — M659 Synovitis and tenosynovitis, unspecified: Secondary | ICD-10-CM

## 2018-09-14 NOTE — Progress Notes (Signed)

## 2018-10-11 ENCOUNTER — Other Ambulatory Visit: Payer: Self-pay

## 2018-10-11 ENCOUNTER — Ambulatory Visit: Payer: Medicare Other | Admitting: Orthotics

## 2018-10-11 DIAGNOSIS — L84 Corns and callosities: Secondary | ICD-10-CM | POA: Diagnosis not present

## 2018-10-11 DIAGNOSIS — E0843 Diabetes mellitus due to underlying condition with diabetic autonomic (poly)neuropathy: Secondary | ICD-10-CM

## 2018-10-11 DIAGNOSIS — E11610 Type 2 diabetes mellitus with diabetic neuropathic arthropathy: Secondary | ICD-10-CM | POA: Diagnosis not present

## 2018-10-11 DIAGNOSIS — R234 Changes in skin texture: Secondary | ICD-10-CM

## 2018-10-11 DIAGNOSIS — M7672 Peroneal tendinitis, left leg: Secondary | ICD-10-CM

## 2018-10-11 DIAGNOSIS — M659 Synovitis and tenosynovitis, unspecified: Secondary | ICD-10-CM

## 2018-10-11 DIAGNOSIS — M216X2 Other acquired deformities of left foot: Secondary | ICD-10-CM | POA: Diagnosis not present

## 2018-10-26 NOTE — Progress Notes (Signed)

## 2019-01-03 ENCOUNTER — Other Ambulatory Visit: Payer: Self-pay

## 2019-01-03 ENCOUNTER — Ambulatory Visit (INDEPENDENT_AMBULATORY_CARE_PROVIDER_SITE_OTHER): Payer: Medicare Other

## 2019-01-03 ENCOUNTER — Other Ambulatory Visit: Payer: Self-pay | Admitting: Podiatry

## 2019-01-03 ENCOUNTER — Ambulatory Visit (INDEPENDENT_AMBULATORY_CARE_PROVIDER_SITE_OTHER): Payer: Medicare Other | Admitting: Podiatry

## 2019-01-03 DIAGNOSIS — M659 Synovitis and tenosynovitis, unspecified: Secondary | ICD-10-CM | POA: Diagnosis not present

## 2019-01-03 DIAGNOSIS — M79671 Pain in right foot: Secondary | ICD-10-CM

## 2019-01-03 MED ORDER — MELOXICAM 15 MG PO TABS: 15.0000 mg | ORAL_TABLET | Freq: Every day | ORAL | 1 refills | Status: DC

## 2019-01-06 NOTE — Progress Notes (Signed)
   Subjective:  43 y.o. male presenting today with a chief complaint of right ankle pain that began about one month ago. He states he tripped on a box and has had intermittent throbbing pain since. Walking and bearing weight increases the pain. He has not had any treatment for the symptoms. Patient is here for further evaluation and treatment.    Past Medical History:  Diagnosis Date  . Acute respiratory failure ([place])   . Allergic rhinitis   . CAP (community acquired pneumonia)   . Cardiac disease   . CHF (congestive heart failure) ([place])   . Depression   . Diabetes mellitus without complication ([place])   . Disturbance, sleep   . ETOH abuse   . Hyperlipidemia   . Hypertension   . Leukocytosis   . Obesity   . OSA (obstructive sleep apnea)   . Sepsis ([place])   . Smoker   . Tobacco abuse      Objective / Physical Exam:  General:  The patient is alert and oriented x3 in no acute distress. Dermatology:  Skin is warm, dry and supple bilateral lower extremities. Negative for open lesions or macerations. Vascular:  Palpable pedal pulses bilaterally. No edema or erythema noted. Capillary refill within normal limits. Neurological:  Epicritic and protective threshold grossly intact bilaterally.  Musculoskeletal Exam:  Pain on palpation to the anterior lateral medial aspects of the patient's right ankle. Mild edema noted. Range of motion within normal limits to all pedal and ankle joints bilateral. Muscle strength 5/5 in all groups bilateral.   Radiographic Exam:  Normal osseous mineralization. Joint spaces preserved. No fracture/dislocation/boney destruction.    Assessment: 1. Ankle synovitis right   Plan of Care:  1. Patient was evaluated. X-Rays reviewed.  2. Injection of 0.5 mL Celestone Soluspan injected in the patient's right ankle. 3. Ankle brace dispensed.  4. Prescription for Meloxicam provided to patient. 5. Continue wearing DM shoes.  6. Return to clinic in 4 weeks.    Edrick Kins, [name] [facility]  Dr. Edrick Kins, [address]                                        [place], [place] [address]                Office [phone]  Fax [phone]

## 2019-01-31 ENCOUNTER — Other Ambulatory Visit: Payer: Self-pay

## 2019-01-31 ENCOUNTER — Encounter: Payer: Self-pay | Admitting: Podiatry

## 2019-01-31 ENCOUNTER — Ambulatory Visit (INDEPENDENT_AMBULATORY_CARE_PROVIDER_SITE_OTHER): Payer: Medicare Other | Admitting: Podiatry

## 2019-01-31 DIAGNOSIS — M659 Synovitis and tenosynovitis, unspecified: Secondary | ICD-10-CM

## 2019-02-03 NOTE — Progress Notes (Signed)
   Subjective:  43 y.o. male presenting today for follow up evaluation of right ankle pain. She states she is doing well and has improved significantly. He reports relief after the injection. He has also been taking the Meloxicam and using the ankle brace as directed. There are no worsening factors noted at this time. Patient is here for further evaluation and treatment.   Past Medical History:  Diagnosis Date  . Acute respiratory failure ([place])   . Allergic rhinitis   . CAP (community acquired pneumonia)   . Cardiac disease   . CHF (congestive heart failure) ([place])   . Depression   . Diabetes mellitus without complication ([place])   . Disturbance, sleep   . ETOH abuse   . Hyperlipidemia   . Hypertension   . Leukocytosis   . Obesity   . OSA (obstructive sleep apnea)   . Sepsis ([place])   . Smoker   . Tobacco abuse      Objective / Physical Exam:  General:  The patient is alert and oriented x3 in no acute distress. Dermatology:  Skin is warm, dry and supple bilateral lower extremities. Negative for open lesions or macerations. Vascular:  Palpable pedal pulses bilaterally. No edema or erythema noted. Capillary refill within normal limits. Neurological:  Epicritic and protective threshold grossly intact bilaterally.  Musculoskeletal Exam:  Range of motion within normal limits to all pedal and ankle joints bilateral. Muscle strength 5/5 in all groups bilateral.   Assessment: 1. Ankle synovitis right - resolved   Plan of Care:  1. Patient was evaluated.  2. Continue taking Meloxicam as needed.  3. Continue wearing DM shoes.  4. Return to clinic as needed.    Edrick Kins, [name] [facility]  Dr. Edrick Kins, [address]                                        [place], [place] [address]                Office [phone]  Fax [phone]

## 2019-05-30 ENCOUNTER — Ambulatory Visit (INDEPENDENT_AMBULATORY_CARE_PROVIDER_SITE_OTHER): Payer: Medicare Other | Admitting: Podiatry

## 2019-05-30 ENCOUNTER — Other Ambulatory Visit: Payer: Self-pay

## 2019-05-30 DIAGNOSIS — E0843 Diabetes mellitus due to underlying condition with diabetic autonomic (poly)neuropathy: Secondary | ICD-10-CM | POA: Diagnosis not present

## 2019-05-30 DIAGNOSIS — M659 Synovitis and tenosynovitis, unspecified: Secondary | ICD-10-CM | POA: Diagnosis not present

## 2019-05-30 DIAGNOSIS — L989 Disorder of the skin and subcutaneous tissue, unspecified: Secondary | ICD-10-CM

## 2019-05-30 DIAGNOSIS — B351 Tinea unguium: Secondary | ICD-10-CM | POA: Diagnosis not present

## 2019-05-30 DIAGNOSIS — M79676 Pain in unspecified toe(s): Secondary | ICD-10-CM | POA: Diagnosis not present

## 2019-05-30 MED ORDER — TERBINAFINE HCL 250 MG PO TABS: 250.0000 mg | ORAL_TABLET | Freq: Every day | ORAL | 1 refills | Status: AC

## 2019-05-30 MED ORDER — KETOCONAZOLE 2 % EX CREA: 1.0000 "application " | TOPICAL_CREAM | Freq: Two times a day (BID) | CUTANEOUS | 2 refills | Status: AC

## 2019-06-02 NOTE — Progress Notes (Signed)
    Subjective: Patient is a 44 y.o. male presenting to the office today with a chief complaint of painful callus lesion(s) noted to the bilateral feet that have been present for the past few months. Walking and bearing weight increases the pain. He has not done anything for treatment of his symptoms.  Patient also complains of elongated, thickened nails that cause pain while ambulating in shoes. He is unable to trim his own nails.  He is also here for follow up evaluation of bilateral ankle pain, left worse than the right. He reports associated swelling of the ankles. Bearing weight and being on his feet increases the pain. Patient presents today for further treatment and evaluation.  Past Medical History:  Diagnosis Date  . Acute respiratory failure (HCC)   . Allergic rhinitis   . CAP (community acquired pneumonia)   . Cardiac disease   . CHF (congestive heart failure) (HCC)   . Depression   . Diabetes mellitus without complication (HCC)   . Disturbance, sleep   . ETOH abuse   . Hyperlipidemia   . Hypertension   . Leukocytosis   . Obesity   . OSA (obstructive sleep apnea)   . Sepsis (HCC)   . Smoker   . Tobacco abuse     Objective:  Physical Exam General: Alert and oriented x3 in no acute distress  Dermatology: Hyperkeratotic lesion(s) present on the bilateral feet. Pain on palpation with a central nucleated core noted. Skin is warm, dry and supple bilateral lower extremities. Negative for open lesions or macerations. Nails are tender, long, thickened and dystrophic with subungual debris, consistent with onychomycosis, 1-5 bilateral. Pruritus noted to the heels of the bilateral feet with hyperkeratosis. No signs of infection noted.  Vascular: Palpable pedal pulses bilaterally. No edema or erythema noted. Capillary refill within normal limits.  Neurological: Epicritic and protective threshold diminished bilaterally.   Musculoskeletal Exam: Pain on palpation at the keratotic  lesion(s) noted as well as the anterior, lateral and medial aspects of the left ankle joint. Range of motion within normal limits bilateral. Muscle strength 5/5 in all groups bilateral.  Assessment: 1. Onychodystrophic nails 1-5 bilateral with hyperkeratosis of nails.  2. Onychomycosis of nail due to dermatophyte bilateral 3. Pre-ulcerative callus lesions to the bilateral feet x 2 4. Ankle synovitis left  5. Tinea pedis bilateral heels    Plan of Care:  1. Patient evaluated. 2. Excisional debridement of keratoic lesion(s) using a chisel blade was performed without incident.  3. Dressed with light dressing. 4. Mechanical debridement of nails 1-5 bilaterally performed using a nail nipper. Filed with dremel without incident.  5. Injection of 0.5 mLs Celestone Soluspan injected into the left ankle joint.  6. Prescription for Lamisil 250 mg #30 provided to patient.  7. Prescription for Ketoconazole 2% cream provided to patient.  8. Patient is to return to the clinic in 3 months.    [name], [name] [facility]  Dr. [name], [name]    [address]                                        [place], [place] [address]                Office [phone]  Fax [phone]

## 2019-08-09 ENCOUNTER — Other Ambulatory Visit: Payer: Self-pay

## 2019-08-09 MED ORDER — DICLOFENAC SODIUM 75 MG PO TBEC: 75.0000 mg | DELAYED_RELEASE_TABLET | Freq: Two times a day (BID) | ORAL | 3 refills | Status: DC

## 2019-08-09 MED ORDER — MELOXICAM 15 MG PO TABS: 15.0000 mg | ORAL_TABLET | Freq: Every day | ORAL | 1 refills | Status: DC

## 2019-09-05 ENCOUNTER — Ambulatory Visit (INDEPENDENT_AMBULATORY_CARE_PROVIDER_SITE_OTHER): Payer: Medicare Other | Admitting: Podiatry

## 2019-09-05 ENCOUNTER — Other Ambulatory Visit: Payer: Self-pay

## 2019-09-05 DIAGNOSIS — L989 Disorder of the skin and subcutaneous tissue, unspecified: Secondary | ICD-10-CM

## 2019-09-05 DIAGNOSIS — M79676 Pain in unspecified toe(s): Secondary | ICD-10-CM | POA: Diagnosis not present

## 2019-09-05 DIAGNOSIS — E0843 Diabetes mellitus due to underlying condition with diabetic autonomic (poly)neuropathy: Secondary | ICD-10-CM | POA: Diagnosis not present

## 2019-09-05 DIAGNOSIS — B351 Tinea unguium: Secondary | ICD-10-CM | POA: Diagnosis not present

## 2019-09-05 NOTE — Progress Notes (Signed)
   SUBJECTIVE Patient with a history of diabetes mellitus presents to office today complaining of elongated, thickened nails that cause pain while ambulating in shoes.  He is unable to trim his own nails. Patient is here for further evaluation and treatment.   Past Medical History:  Diagnosis Date  . Acute respiratory failure (HCC)   . Allergic rhinitis   . CAP (community acquired pneumonia)   . Cardiac disease   . CHF (congestive heart failure) (HCC)   . Depression   . Diabetes mellitus without complication (HCC)   . Disturbance, sleep   . ETOH abuse   . Hyperlipidemia   . Hypertension   . Leukocytosis   . Obesity   . OSA (obstructive sleep apnea)   . Sepsis (HCC)   . Smoker   . Tobacco abuse     OBJECTIVE General Patient is awake, alert, and oriented x 3 and in no acute distress. Derm Skin is dry and supple bilateral. Negative open lesions or macerations. Remaining integument unremarkable. Nails are tender, long, thickened and dystrophic with subungual debris, consistent with onychomycosis, 1-5 bilateral. No signs of infection noted.  Hyperkeratotic callus noted to the bilateral feet without ulceration. Vasc  DP and PT pedal pulses palpable bilaterally. Temperature gradient within normal limits.  Neuro Epicritic and protective threshold sensation diminished bilaterally.  Musculoskeletal Exam No symptomatic pedal deformities noted bilateral. Muscular strength within normal limits.  ASSESSMENT 1. Diabetes Mellitus w/ peripheral neuropathy 2. Onychomycosis of nail due to dermatophyte bilateral 3.  Preulcerative callus lesions bilateral  4.  Pain in foot bilateral  PLAN OF CARE 1. Patient evaluated today. 2. Instructed to maintain good pedal hygiene and foot care. Stressed importance of controlling blood sugar.  3. Mechanical debridement of nails 1-5 bilaterally performed using a nail nipper. Filed with dremel without incident.  4.  Excisional debridement of the preulcerative  callus lesions was performed using a tissue nipper without incident or bleeding.   5.  Appointment with Pedorthist for new diabetic shoes and insoles  6.  Return to clinic in 3 mos.     [name], [name] [facility]  Dr. [name], [name]    [address]                                        [place], [place] [address]                Office [phone]  Fax [phone]

## 2019-09-12 ENCOUNTER — Other Ambulatory Visit: Payer: Medicare Other | Admitting: Orthotics

## 2019-09-26 ENCOUNTER — Other Ambulatory Visit: Payer: Self-pay

## 2019-09-26 ENCOUNTER — Ambulatory Visit: Payer: Medicare Other | Admitting: Orthotics

## 2019-09-26 DIAGNOSIS — M7672 Peroneal tendinitis, left leg: Secondary | ICD-10-CM

## 2019-09-26 DIAGNOSIS — E0843 Diabetes mellitus due to underlying condition with diabetic autonomic (poly)neuropathy: Secondary | ICD-10-CM

## 2019-09-26 DIAGNOSIS — R234 Changes in skin texture: Secondary | ICD-10-CM

## 2019-09-26 DIAGNOSIS — L989 Disorder of the skin and subcutaneous tissue, unspecified: Secondary | ICD-10-CM

## 2019-09-26 NOTE — Progress Notes (Signed)

## 2019-10-26 ENCOUNTER — Telehealth: Payer: Self-pay | Admitting: Podiatry

## 2019-10-26 NOTE — Telephone Encounter (Signed)
Pt left message checking status of diabetic shoes.  I returned call and explained that it looks like they should be shipping anytime and I would call when they come in to schedule an appt  But if he does not hear from me in a month to please call me back.

## 2019-10-31 ENCOUNTER — Encounter: Payer: Self-pay | Admitting: Podiatry

## 2019-10-31 ENCOUNTER — Other Ambulatory Visit: Payer: Self-pay

## 2019-10-31 ENCOUNTER — Ambulatory Visit (INDEPENDENT_AMBULATORY_CARE_PROVIDER_SITE_OTHER): Payer: Medicare Other | Admitting: Podiatry

## 2019-10-31 DIAGNOSIS — M659 Synovitis and tenosynovitis, unspecified: Secondary | ICD-10-CM | POA: Diagnosis not present

## 2019-10-31 DIAGNOSIS — M79674 Pain in right toe(s): Secondary | ICD-10-CM

## 2019-10-31 DIAGNOSIS — M79675 Pain in left toe(s): Secondary | ICD-10-CM

## 2019-10-31 DIAGNOSIS — B351 Tinea unguium: Secondary | ICD-10-CM

## 2019-10-31 DIAGNOSIS — E0843 Diabetes mellitus due to underlying condition with diabetic autonomic (poly)neuropathy: Secondary | ICD-10-CM | POA: Diagnosis not present

## 2019-10-31 DIAGNOSIS — L989 Disorder of the skin and subcutaneous tissue, unspecified: Secondary | ICD-10-CM | POA: Diagnosis not present

## 2019-10-31 NOTE — Progress Notes (Signed)
   SUBJECTIVE Patient with a history of diabetes mellitus presents to office today complaining of elongated, thickened nails that cause pain while ambulating in shoes.  He is unable to trim his own nails.  Patient also states that he is having some left foot and ankle pain flareup.  Patient has history of pes planus deformity to the left foot that causes ankle pain as well.  He has been molded for custom molded orthotics and he states that they will be dispensed in approximately 2 weeks.  Over the past several weeks he has had an acute flareup of ankle pain.  Patient is here for further evaluation and treatment.   Past Medical History:  Diagnosis Date  . Acute respiratory failure (HCC)   . Allergic rhinitis   . CAP (community acquired pneumonia)   . Cardiac disease   . CHF (congestive heart failure) (HCC)   . Depression   . Diabetes mellitus without complication (HCC)   . Disturbance, sleep   . ETOH abuse   . Hyperlipidemia   . Hypertension   . Leukocytosis   . Obesity   . OSA (obstructive sleep apnea)   . Sepsis (HCC)   . Smoker   . Tobacco abuse     OBJECTIVE General Patient is awake, alert, and oriented x 3 and in no acute distress. Derm Skin is dry and supple bilateral. Negative open lesions or macerations. Remaining integument unremarkable. Nails are tender, long, thickened and dystrophic with subungual debris, consistent with onychomycosis, 1-5 bilateral. No signs of infection noted.  Hyperkeratotic callus noted to the bilateral feet without ulceration. Vasc  DP and PT pedal pulses palpable bilaterally. Temperature gradient within normal limits.  Neuro Epicritic and protective threshold sensation diminished bilaterally.  Musculoskeletal Exam pes planus deformity noted to the left foot with a rear foot valgus deformity.  Pain on palpation also noted to the anterior medial lateral aspect of the left ankle joint.  ASSESSMENT 1. Diabetes Mellitus w/ peripheral neuropathy 2.  Onychomycosis of nail due to dermatophyte bilateral 3.  Preulcerative callus lesions bilateral  4.  Synovitis/DJD left ankle 5.  Pes planus left foot/PTTD   PLAN OF CARE 1. Patient evaluated today. 2. Instructed to maintain good pedal hygiene and foot care. Stressed importance of controlling blood sugar.  3. Mechanical debridement of nails 1-5 bilaterally performed using a nail nipper. Filed with dremel without incident.  4.  Excisional debridement of the preulcerative callus lesions was performed using a tissue nipper without incident or bleeding.   5.  Custom molded diabetic shoes and insoles pending 6.  Injection of 0.5 cc Celestone Soluspan injection to the left ankle joint 7.  Return to clinic in 6 weeks   [name], [name] [facility]  Dr. [name], [name]    [address]                                        [place], [place] [address]                Office [phone]  Fax [phone]

## 2019-12-05 ENCOUNTER — Other Ambulatory Visit: Payer: Self-pay

## 2019-12-05 ENCOUNTER — Ambulatory Visit (INDEPENDENT_AMBULATORY_CARE_PROVIDER_SITE_OTHER): Payer: Medicare Other | Admitting: Podiatry

## 2019-12-05 ENCOUNTER — Other Ambulatory Visit: Payer: Self-pay | Admitting: Podiatry

## 2019-12-05 DIAGNOSIS — M659 Synovitis and tenosynovitis, unspecified: Secondary | ICD-10-CM

## 2019-12-05 DIAGNOSIS — M19072 Primary osteoarthritis, left ankle and foot: Secondary | ICD-10-CM

## 2019-12-05 DIAGNOSIS — M76822 Posterior tibial tendinitis, left leg: Secondary | ICD-10-CM

## 2019-12-05 DIAGNOSIS — M7672 Peroneal tendinitis, left leg: Secondary | ICD-10-CM

## 2019-12-05 MED ORDER — MELOXICAM 15 MG PO TABS: 15.0000 mg | ORAL_TABLET | Freq: Every day | ORAL | 1 refills | Status: DC

## 2019-12-05 NOTE — Progress Notes (Signed)
   SUBJECTIVE Patient with a history of diabetes mellitus presents to office today for follow-up evaluation of left ankle pain.  Last visit on 10/31/2019 he received a steroid injection which she says helped significantly.  He is also been wearing the ankle brace with a compression ankle sleeve to the left ankle.  He is unable to walk without the ankle brace and sleeve.  He has been taking meloxicam 15 mg daily as prescribed.  No new complaints at this time  Past Medical History:  Diagnosis Date  . Acute respiratory failure (HCC)   . Allergic rhinitis   . CAP (community acquired pneumonia)   . Cardiac disease   . CHF (congestive heart failure) (HCC)   . Depression   . Diabetes mellitus without complication (HCC)   . Disturbance, sleep   . ETOH abuse   . Hyperlipidemia   . Hypertension   . Leukocytosis   . Obesity   . OSA (obstructive sleep apnea)   . Sepsis (HCC)   . Smoker   . Tobacco abuse     OBJECTIVE General Patient is awake, alert, and oriented x 3 and in no acute distress. Derm Skin is dry and supple bilateral. Negative open lesions or macerations. Remaining integument unremarkable. Nails are tender, long, thickened and dystrophic with subungual debris, consistent with onychomycosis, 1-5 bilateral. No signs of infection noted.  Hyperkeratotic callus noted to the bilateral feet without ulceration. Vasc  DP and PT pedal pulses palpable bilaterally. Temperature gradient within normal limits.  Neuro Epicritic and protective threshold sensation diminished bilaterally.  Musculoskeletal Exam pes planus deformity noted to the left foot with a rear foot valgus deformity.  Pain on palpation also noted to the anterior medial lateral aspect of the left ankle joint.  ASSESSMENT 1. Diabetes Mellitus w/ peripheral neuropathy 2. Synovitis/DJD left ankle 3.  Pes planus left foot/PTTD   PLAN OF CARE 1. Patient evaluated today. 2. Instructed to maintain good pedal hygiene and foot care.  Stressed importance of controlling blood sugar.  3. Custom molded diabetic shoes and insoles pending 4.  Injection of 0.5 cc Celestone Soluspan injection to the left ankle joint 5.  Return to clinic in 2 months for routine foot care   [name], [name] [facility]  Dr. [name], [name]    [address]                                        [place], [place] [address]                Office [phone]  Fax [phone]

## 2020-01-19 ENCOUNTER — Other Ambulatory Visit: Payer: Self-pay | Admitting: Podiatry

## 2020-01-25 NOTE — Telephone Encounter (Signed)
Please advise 

## 2020-02-06 ENCOUNTER — Ambulatory Visit: Payer: Medicare Other | Admitting: Podiatry

## 2020-02-08 ENCOUNTER — Ambulatory Visit: Payer: Medicare Other

## 2020-02-29 ENCOUNTER — Ambulatory Visit: Payer: Medicare Other | Admitting: Podiatry

## 2020-03-14 ENCOUNTER — Ambulatory Visit (INDEPENDENT_AMBULATORY_CARE_PROVIDER_SITE_OTHER): Payer: Medicare Other | Admitting: Podiatry

## 2020-03-14 ENCOUNTER — Other Ambulatory Visit: Payer: Self-pay

## 2020-03-14 DIAGNOSIS — M79674 Pain in right toe(s): Secondary | ICD-10-CM | POA: Diagnosis not present

## 2020-03-14 DIAGNOSIS — M216X2 Other acquired deformities of left foot: Secondary | ICD-10-CM | POA: Diagnosis not present

## 2020-03-14 DIAGNOSIS — B351 Tinea unguium: Secondary | ICD-10-CM

## 2020-03-14 DIAGNOSIS — M79675 Pain in left toe(s): Secondary | ICD-10-CM | POA: Diagnosis not present

## 2020-03-14 DIAGNOSIS — E1140 Type 2 diabetes mellitus with diabetic neuropathy, unspecified: Secondary | ICD-10-CM | POA: Diagnosis not present

## 2020-03-14 DIAGNOSIS — M216X1 Other acquired deformities of right foot: Secondary | ICD-10-CM | POA: Diagnosis not present

## 2020-03-14 DIAGNOSIS — L84 Corns and callosities: Secondary | ICD-10-CM | POA: Diagnosis not present

## 2020-03-19 NOTE — Progress Notes (Signed)
   SUBJECTIVE Patient presents to office today complaining of elongated, thickened nails that cause pain while ambulating in shoes.  He is unable to trim his own nails. Patient is here for further evaluation and treatment.  Past Medical History:  Diagnosis Date  . Acute respiratory failure (HCC)   . Allergic rhinitis   . CAP (community acquired pneumonia)   . Cardiac disease   . CHF (congestive heart failure) (HCC)   . Depression   . Diabetes mellitus without complication (HCC)   . Disturbance, sleep   . ETOH abuse   . Hyperlipidemia   . Hypertension   . Leukocytosis   . Obesity   . OSA (obstructive sleep apnea)   . Sepsis (HCC)   . Smoker   . Tobacco abuse     OBJECTIVE General Patient is awake, alert, and oriented x 3 and in no acute distress. Derm Skin is dry and supple bilateral. Negative open lesions or macerations. Remaining integument unremarkable. Nails are tender, long, thickened and dystrophic with subungual debris, consistent with onychomycosis, 1-5 bilateral. No signs of infection noted. Vasc  DP and PT pedal pulses palpable bilaterally. Temperature gradient within normal limits.  Neuro Epicritic and protective threshold sensation grossly intact bilaterally.  Musculoskeletal Exam No symptomatic pedal deformities noted bilateral. Muscular strength within normal limits.  ASSESSMENT 1. Onychodystrophic nails 1-5 bilateral with hyperkeratosis of nails.  2. Onychomycosis of nail due to dermatophyte bilateral 3. Pain in foot bilateral  PLAN OF CARE 1. Patient evaluated today.  2. Instructed to maintain good pedal hygiene and foot care.  3. Mechanical debridement of nails 1-5 bilaterally performed using a nail nipper. Filed with dremel without incident.  4. Return to clinic in 3 mos.    [name], [name] [facility]  Dr. [name], [name]    [address] [place], [place] [address]                Office  [phone]  Fax [phone]

## 2020-04-11 DIAGNOSIS — L729 Follicular cyst of the skin and subcutaneous tissue, unspecified: Secondary | ICD-10-CM | POA: Diagnosis not present

## 2020-04-11 DIAGNOSIS — L02213 Cutaneous abscess of chest wall: Secondary | ICD-10-CM | POA: Diagnosis not present

## 2020-04-20 DIAGNOSIS — L02213 Cutaneous abscess of chest wall: Secondary | ICD-10-CM | POA: Diagnosis not present

## 2020-04-30 DIAGNOSIS — E11610 Type 2 diabetes mellitus with diabetic neuropathic arthropathy: Secondary | ICD-10-CM | POA: Diagnosis not present

## 2020-04-30 DIAGNOSIS — I110 Hypertensive heart disease with heart failure: Secondary | ICD-10-CM | POA: Diagnosis not present

## 2020-04-30 DIAGNOSIS — I10 Essential (primary) hypertension: Secondary | ICD-10-CM | POA: Diagnosis not present

## 2020-04-30 DIAGNOSIS — I509 Heart failure, unspecified: Secondary | ICD-10-CM | POA: Diagnosis not present

## 2020-04-30 DIAGNOSIS — E119 Type 2 diabetes mellitus without complications: Secondary | ICD-10-CM | POA: Diagnosis not present

## 2020-04-30 DIAGNOSIS — L02213 Cutaneous abscess of chest wall: Secondary | ICD-10-CM | POA: Diagnosis not present

## 2020-04-30 DIAGNOSIS — E1169 Type 2 diabetes mellitus with other specified complication: Secondary | ICD-10-CM | POA: Diagnosis not present

## 2020-04-30 DIAGNOSIS — E1165 Type 2 diabetes mellitus with hyperglycemia: Secondary | ICD-10-CM | POA: Diagnosis not present

## 2020-04-30 DIAGNOSIS — E785 Hyperlipidemia, unspecified: Secondary | ICD-10-CM | POA: Diagnosis not present

## 2020-04-30 DIAGNOSIS — Z7984 Long term (current) use of oral hypoglycemic drugs: Secondary | ICD-10-CM | POA: Diagnosis not present

## 2020-05-23 DIAGNOSIS — L02213 Cutaneous abscess of chest wall: Secondary | ICD-10-CM | POA: Diagnosis not present

## 2020-05-30 DIAGNOSIS — I110 Hypertensive heart disease with heart failure: Secondary | ICD-10-CM | POA: Diagnosis not present

## 2020-05-30 DIAGNOSIS — E11610 Type 2 diabetes mellitus with diabetic neuropathic arthropathy: Secondary | ICD-10-CM | POA: Diagnosis not present

## 2020-05-30 DIAGNOSIS — E785 Hyperlipidemia, unspecified: Secondary | ICD-10-CM | POA: Diagnosis not present

## 2020-05-30 DIAGNOSIS — E119 Type 2 diabetes mellitus without complications: Secondary | ICD-10-CM | POA: Diagnosis not present

## 2020-05-30 DIAGNOSIS — I10 Essential (primary) hypertension: Secondary | ICD-10-CM | POA: Diagnosis not present

## 2020-05-30 DIAGNOSIS — E1169 Type 2 diabetes mellitus with other specified complication: Secondary | ICD-10-CM | POA: Diagnosis not present

## 2020-06-04 ENCOUNTER — Other Ambulatory Visit: Payer: Self-pay

## 2020-06-04 ENCOUNTER — Encounter (HOSPITAL_BASED_OUTPATIENT_CLINIC_OR_DEPARTMENT_OTHER): Payer: Medicare Other | Attending: Internal Medicine | Admitting: Internal Medicine

## 2020-06-04 DIAGNOSIS — I10 Essential (primary) hypertension: Secondary | ICD-10-CM | POA: Insufficient documentation

## 2020-06-04 DIAGNOSIS — G4733 Obstructive sleep apnea (adult) (pediatric): Secondary | ICD-10-CM | POA: Diagnosis not present

## 2020-06-04 DIAGNOSIS — E11610 Type 2 diabetes mellitus with diabetic neuropathic arthropathy: Secondary | ICD-10-CM | POA: Diagnosis not present

## 2020-06-04 DIAGNOSIS — F17210 Nicotine dependence, cigarettes, uncomplicated: Secondary | ICD-10-CM | POA: Diagnosis not present

## 2020-06-04 DIAGNOSIS — E11622 Type 2 diabetes mellitus with other skin ulcer: Secondary | ICD-10-CM | POA: Insufficient documentation

## 2020-06-04 DIAGNOSIS — L98492 Non-pressure chronic ulcer of skin of other sites with fat layer exposed: Secondary | ICD-10-CM | POA: Diagnosis not present

## 2020-06-04 DIAGNOSIS — L98498 Non-pressure chronic ulcer of skin of other sites with other specified severity: Secondary | ICD-10-CM | POA: Insufficient documentation

## 2020-06-04 DIAGNOSIS — L02213 Cutaneous abscess of chest wall: Secondary | ICD-10-CM | POA: Diagnosis not present

## 2020-06-04 NOTE — Progress Notes (Signed)
NAMAN, SPYCHALSKI ([id number]) Visit Report for 06/04/2020 Chief Complaint Document Details Patient Name: Date of Service: Marc Galloway, Marc Marc E. 06/04/2020 2:45 PM Medical Record Number: [id number] Patient Account Number: [id number] Date of Birth/Sex: Treating RN: May 28, 1975 (45 y.o. Male) Marc Galloway Primary Care Provider: Jonathon Galloway Other Clinician: Referring Provider: Treating Provider/Extender: Marc Galloway in Treatment: 0 Information Obtained from: Patient Chief Complaint 06/04/2020; this is a patient with a open wound on the left upper anterior chest Electronic Signature(s) Signed: 06/04/2020 5:03:25 PM By: Marc Ham MD Entered By: Marc Galloway on 06/04/2020 15:57:30 -------------------------------------------------------------------------------- Debridement Details Patient Name: Date of Service: Marc Galloway Marc E. 06/04/2020 2:45 PM Medical Record Number: [id number] Patient Account Number: [id number] Date of Birth/Sex: Treating RN: 05-Apr-1975 (45 y.o. Male) Marc Galloway Primary Care Provider: Jonathon Galloway Other Clinician: Referring Provider: Treating Provider/Extender: Marc Galloway in Treatment: 0 Debridement Performed for Assessment: Wound #1 Left Chest Performed By: Clinician Marc Hurst, RN Debridement Type: Chemical/Enzymatic/Mechanical Agent Used: Anasept and gauze to remove biofilm Level of Consciousness (Pre-procedure): Awake and Alert Pre-procedure Verification/Time Out Yes - 15:50 Taken: Bleeding: None Response to Treatment: Procedure was tolerated well Level of Consciousness (Post- Awake and Alert procedure): Post Debridement Measurements of Total Wound Length: (cm) 0.6 Width: (cm) 1 Depth: (cm) 1 Volume: (cm) 0.471 Character of Wound/Ulcer Post Debridement: Improved Post Procedure Diagnosis Same as Pre-procedure Electronic Signature(s) Signed: 06/04/2020 5:03:25 PM By: Marc Ham MD Signed:  06/04/2020 5:12:11 PM By: Marc Hurst RN, BSN Entered By: Marc Galloway on 06/04/2020 15:49:51 -------------------------------------------------------------------------------- HPI Details Patient Name: Date of Service: Marc Galloway Marc E. 06/04/2020 2:45 PM Medical Record Number: [id number] Patient Account Number: [id number] Date of Birth/Sex: Treating RN: 02-26-1976 (45 y.o. Male) Marc Galloway Primary Care Provider: Jonathon Galloway Other Clinician: Referring Provider: Treating Provider/Extender: Marc Galloway in Treatment: 0 History of Present Illness HPI Description: ADMISSION 06/04/2020 This is a 45 year old man who is a type II diabetic. Apparently in January noticed some itching on his anterior upper chest scratched incessantly and then opened the wound. According to his primary doctor's notes from 04/20/2020 this presented as a cyst that either popped or was I indeed. He was placed on antibiotics doxycycline. The patient thinks a culture that was done that was negative although there was a fair amount of drainage. Fortunately the area never really healed. He now has an open area roughly 1 cm in depth and 1.7 cm of undermining at roughly 12:00 Past medical history includes type 2 diabetes, alcohol use, tobacco abuse, left ankle wound, depression, obstructive sleep apnea, hypertension, hyperlipidemia, left ankle Charcot deformity Electronic Signature(s) Signed: 06/04/2020 5:03:25 PM By: Marc Ham MD Entered By: Marc Galloway on 06/04/2020 15:59:42 -------------------------------------------------------------------------------- Chemical Cauterization Details Patient Name: Date of Service: Marc Galloway Marc E. 06/04/2020 2:45 PM Medical Record Number: [id number] Patient Account Number: [id number] Date of Birth/Sex: Treating RN: 04-29-1975 (45 y.o. Male) Marc Galloway Primary Care Provider: Jonathon Galloway Other Clinician: Referring Provider: Treating  Provider/Extender: Marc Galloway in Treatment: 0 Procedure Performed for: Wound #1 Left Chest Performed By: Physician Marc Galloway., MD Post Procedure Diagnosis Same as Pre-procedure Notes silver nitrate Electronic Signature(s) Signed: 06/04/2020 5:03:25 PM By: Marc Ham MD Entered By: Marc Galloway on 06/04/2020 15:57:04 -------------------------------------------------------------------------------- Physical Exam Details Patient Name: Date of Service: Marc Galloway Marc E. 06/04/2020 2:45 PM Medical Record Number: [id number] Patient Account Number: [id number] Date of Birth/Sex: Treating RN: 30-Dec-1975 (45 y.o. Male) Marc Galloway Primary Care Provider:  Jonathon Galloway Other Clinician: Referring Provider: Treating Provider/Extender: Marc Galloway in Treatment: 0 Integumentary (Hair, Skin) Skin and subcutaneous tissue without rashes, lesions. Notes Wound exam; the patient has a small open area but with a relatively large amount of superior undermining. Direct depth of 1 cm 1.7 cm of undermining at roughly 12:00. This does not go to bone. Base of this tissue is very friable weeping bleeds very easily. This does not go to palpable bone. It does not look infected Electronic Signature(s) Signed: 06/04/2020 5:03:25 PM By: Marc Ham MD Entered By: Marc Galloway on 06/04/2020 16:02:21 -------------------------------------------------------------------------------- Physician Orders Details Patient Name: Date of Service: Marc Galloway Marc E. 06/04/2020 2:45 PM Medical Record Number: [id number] Patient Account Number: [id number] Date of Birth/Sex: Treating RN: 04-Dec-1975 (45 y.o. Male) Marc Galloway Primary Care Provider: Jonathon Galloway Other Clinician: Referring Provider: Treating Provider/Extender: Marc Galloway in Treatment: 0 Verbal / Phone Orders: No Diagnosis Coding Follow-up Appointments Return  Appointment in 1 week. Bathing/ Shower/ Hygiene May shower and wash wound with soap and water. - on days that dressing is changed Wound Treatment Wound #1 - Chest Wound Laterality: Left Cleanser: Byram Ancillary Kit - 15 Day Supply (DME) (Generic) Every Other Day/15 Days Discharge Instructions: Use supplies as instructed; Kit contains: (15) Saline Bullets; (15) 3x3 Gauze; 15 pr Gloves Peri-Wound Care: Skin Prep (DME) (Generic) Every Other Day/15 Days Discharge Instructions: Use skin prep as directed Prim Dressing: KerraCel Ag Gelling Fiber Dressing, 2x2 in (silver alginate) (DME) (Generic) Every Other Day/15 Days ary Discharge Instructions: Apply silver alginate to wound bed as instructed Secondary Dressing: Bordered Gauze, 4x4 in (DME) (Generic) Every Other Day/15 Days Discharge Instructions: Apply over primary dressing as directed. Electronic Signature(s) Signed: 06/04/2020 5:03:25 PM By: Marc Ham MD Signed: 06/04/2020 5:12:11 PM By: Marc Hurst RN, BSN Entered By: Marc Galloway on 06/04/2020 15:59:54 -------------------------------------------------------------------------------- Problem List Details Patient Name: Date of Service: Marc Galloway Marc E. 06/04/2020 2:45 PM Medical Record Number: [id number] Patient Account Number: [id number] Date of Birth/Sex: Treating RN: 04-Jan-1976 (44 y.o. Male) Marc Galloway Primary Care Provider: Jonathon Galloway Other Clinician: Referring Provider: Treating Provider/Extender: Marc Galloway in Treatment: 0 Active Problems ICD-10 Encounter Code Description Active Date MDM Diagnosis L98.498 Non-pressure chronic ulcer of skin of other sites with other specified severity 06/04/2020 No Yes L72.3 Sebaceous cyst 06/04/2020 No Yes Inactive Problems Resolved Problems Electronic Signature(s) Signed: 06/04/2020 5:03:25 PM By: Marc Ham MD Entered By: Marc Galloway on 06/04/2020  15:56:47 -------------------------------------------------------------------------------- Progress Note Details Patient Name: Date of Service: Marc Galloway Marc E. 06/04/2020 2:45 PM Medical Record Number: [id number] Patient Account Number: [id number] Date of Birth/Sex: Treating RN: 1976/01/01 (44 y.o. Male) Marc Galloway Primary Care Provider: Jonathon Galloway Other Clinician: Referring Provider: Treating Provider/Extender: Marc Galloway in Treatment: 0 Subjective Chief Complaint Information obtained from Patient 06/04/2020; this is a patient with a open wound on the left upper anterior chest History of Present Illness (HPI) ADMISSION 06/04/2020 This is a 45 year old man who is a type II diabetic. Apparently in January noticed some itching on his anterior upper chest scratched incessantly and then opened the wound. According to his primary doctor's notes from 04/20/2020 this presented as a cyst that either popped or was I indeed. He was placed on antibiotics doxycycline. The patient thinks a culture that was done that was negative although there was a fair amount of drainage. Fortunately the area never really healed. He now has an open area roughly  1 cm in depth and 1.7 cm of undermining at roughly 12:00 Past medical history includes type 2 diabetes, alcohol use, tobacco abuse, left ankle wound, depression, obstructive sleep apnea, hypertension, hyperlipidemia, left ankle Charcot deformity Patient History Information obtained from Patient. Allergies metformin (Reaction: agitation/aggression), Actos (Reaction: CHF) Family History Cancer - Mother, Heart Disease - Mother, Hypertension - Mother, No family history of Diabetes, Hereditary Spherocytosis, Kidney Disease, Lung Disease, Seizures, Stroke, Thyroid Problems, Tuberculosis. Social History Current every day smoker - 1/2 ppd, Marital Status - Single, Alcohol Use - Daily - beer, Drug Use - No History, Caffeine Use -  Daily - coffee, tea, soda. Medical History Eyes Denies history of Cataracts, Glaucoma, Optic Neuritis Ear/Nose/Mouth/Throat Denies history of Chronic sinus problems/congestion, Middle ear problems Respiratory Patient has history of Sleep Apnea Cardiovascular Patient has history of Congestive Heart Failure, Hypertension Endocrine Patient has history of Type II Diabetes Denies history of Type I Diabetes Genitourinary Denies history of End Stage Renal Disease Integumentary (Skin) Denies history of History of Burn Musculoskeletal Patient has history of Osteoarthritis Denies history of Gout, Rheumatoid Arthritis, Osteomyelitis Neurologic Patient has history of Neuropathy Denies history of Dementia, Quadriplegia, Paraplegia, Seizure Disorder Oncologic Denies history of Received Chemotherapy, Received Radiation Psychiatric Denies history of Anorexia/bulimia, Confinement Anxiety Patient is treated with Insulin, Oral Agents. Blood sugar is not tested. Hospitalization/Surgery History - tonsillectomy. - tubes in ears. - nasal reconstruction. Medical A Surgical History Notes nd Constitutional Symptoms (General Health) morbid obesity Cardiovascular hyperlipidemia Musculoskeletal charcot left ankle Psychiatric depression Review of Systems (ROS) Constitutional Symptoms (General Health) Denies complaints or symptoms of Fatigue, Fever, Chills, Marked Weight Change. Eyes Complains or has symptoms of Glasses / Contacts. Denies complaints or symptoms of Dry Eyes, Vision Changes. Ear/Nose/Mouth/Throat Denies complaints or symptoms of Chronic sinus problems or rhinitis. Respiratory Denies complaints or symptoms of Chronic or frequent coughs, Shortness of Breath. Cardiovascular Denies complaints or symptoms of Chest pain. Gastrointestinal Denies complaints or symptoms of Frequent diarrhea, Nausea, Vomiting. Genitourinary Denies complaints or symptoms of Frequent  urination. Integumentary (Skin) Complains or has symptoms of Wounds - left chest. Musculoskeletal Denies complaints or symptoms of Muscle Pain, Muscle Weakness. Neurologic Complains or has symptoms of Numbness/parasthesias. Psychiatric Denies complaints or symptoms of Claustrophobia, Suicidal. Objective Constitutional Vitals Time Taken: 2:52 PM, Height: 65 in, Source: Stated, Weight: 256 lbs, Source: Stated, BMI: 42.6, Temperature: 98.8 F, Pulse: 99 bpm, Respiratory Rate: 20 breaths/min, Blood Pressure: 154/101 mmHg, Capillary Blood Glucose: 187 mg/dl. General Notes: glucose per pt report yesterday am General Notes: Wound exam; the patient has a small open area but with a relatively large amount of superior undermining. Direct depth of 1 cm 1.7 cm of undermining at roughly 12:00. This does not go to bone. Base of this tissue is very friable weeping bleeds very easily. This does not go to palpable bone. It does not look infected Integumentary (Hair, Skin) Skin and subcutaneous tissue without rashes, lesions. Wound #1 status is Open. Original cause of wound was Bump. The date acquired was: 03/26/2020. The wound is located on the Left Chest. The wound measures 0.6cm length x 1cm width x 1cm depth; 0.471cm^2 area and 0.471cm^3 volume. There is Fat Layer (Subcutaneous Tissue) exposed. There is no undermining noted, however, there is tunneling at 12:00 with a maximum distance of 1.7cm. There is a medium amount of serosanguineous drainage noted. The wound margin is distinct with the outline attached to the wound base. There is large (67-100%) red, friable granulation within the wound bed. There  is no necrotic tissue within the wound bed. Assessment Active Problems ICD-10 Non-pressure chronic ulcer of skin of other sites with other specified severity Sebaceous cyst Procedures Wound #1 Pre-procedure diagnosis of Wound #1 is an Abscess located on the Left Chest . There was a  Chemical/Enzymatic/Mechanical debridement performed by Marc Hurst, RN.. Other agent used was Anasept and gauze to remove biofilm. A time out was conducted at 15:50, prior to the start of the procedure. There was no bleeding. The procedure was tolerated well. Post Debridement Measurements: 0.6cm length x 1cm width x 1cm depth; 0.471cm^3 volume. Character of Wound/Ulcer Post Debridement is improved. Post procedure Diagnosis Wound #1: Same as Pre-Procedure Pre-procedure diagnosis of Wound #1 is an Abscess located on the Left Chest . An Chemical Cauterization procedure was performed by Marc Galloway., MD. Post procedure Diagnosis Wound #1: Same as Pre-Procedure Notes: silver nitrate Plan Follow-up Appointments: Return Appointment in 1 week. Bathing/ Shower/ Hygiene: May shower and wash wound with soap and water. - on days that dressing is changed WOUND #1: - Chest Wound Laterality: Left Cleanser: Byram Ancillary Kit - 15 Day Supply (DME) (Generic) Every Other Day/15 Days Discharge Instructions: Use supplies as instructed; Kit contains: (15) Saline Bullets; (15) 3x3 Gauze; 15 pr Gloves Peri-Wound Care: Skin Prep (DME) (Generic) Every Other Day/15 Days Discharge Instructions: Use skin prep as directed Prim Dressing: KerraCel Ag Gelling Fiber Dressing, 2x2 in (silver alginate) (DME) (Generic) Every Other Day/15 Days ary Discharge Instructions: Apply silver alginate to wound bed as instructed Secondary Dressing: Bordered Gauze, 4x4 in (DME) (Generic) Every Other Day/15 Days Discharge Instructions: Apply over primary dressing as directed. 1. Although this was advertised as an abscess the patient states it was not tender at all even before it open therefore an abscess seems unlikely. Epidermoid cyst/sebaceous cysts are also painful usually to the touch. 2. There was nothing about this the really suggest that it need to be biopsied although I will keep watching this. 3. Use silver alginate  gently packed into the wound 4. He is completed a course of doxycycline last month I did not culture this did not add antibiotics. 5. We will follow in 1 week Electronic Signature(s) Signed: 06/04/2020 5:03:25 PM By: Marc Ham MD Entered By: Marc Galloway on 06/04/2020 16:03:39 -------------------------------------------------------------------------------- HxROS Details Patient Name: Date of Service: Marc Galloway Marc E. 06/04/2020 2:45 PM Medical Record Number: [id number] Patient Account Number: [id number] Date of Birth/Sex: Treating RN: Apr 09, 1975 (44 y.o. Male) Marc Galloway Primary Care Provider: Jonathon Galloway Other Clinician: Referring Provider: Treating Provider/Extender: Marc Galloway in Treatment: 0 Information Obtained From Patient Constitutional Symptoms (General Health) Complaints and Symptoms: Negative for: Fatigue; Fever; Chills; Marked Weight Change Medical History: Past Medical History Notes: morbid obesity Eyes Complaints and Symptoms: Positive for: Glasses / Contacts Negative for: Dry Eyes; Vision Changes Medical History: Negative for: Cataracts; Glaucoma; Optic Neuritis Ear/Nose/Mouth/Throat Complaints and Symptoms: Negative for: Chronic sinus problems or rhinitis Medical History: Negative for: Chronic sinus problems/congestion; Middle ear problems Respiratory Complaints and Symptoms: Negative for: Chronic or frequent coughs; Shortness of Breath Medical History: Positive for: Sleep Apnea Cardiovascular Complaints and Symptoms: Negative for: Chest pain Medical History: Positive for: Congestive Heart Failure; Hypertension Past Medical History Notes: hyperlipidemia Gastrointestinal Complaints and Symptoms: Negative for: Frequent diarrhea; Nausea; Vomiting Genitourinary Complaints and Symptoms: Negative for: Frequent urination Medical History: Negative for: End Stage Renal Disease Integumentary (Skin) Complaints  and Symptoms: Positive for: Wounds - left chest Medical History: Negative for: History of Burn  Musculoskeletal Complaints and Symptoms: Negative for: Muscle Pain; Muscle Weakness Medical History: Positive for: Osteoarthritis Negative for: Gout; Rheumatoid Arthritis; Osteomyelitis Past Medical History Notes: charcot left ankle Neurologic Complaints and Symptoms: Positive for: Numbness/parasthesias Medical History: Positive for: Neuropathy Negative for: Dementia; Quadriplegia; Paraplegia; Seizure Disorder Psychiatric Complaints and Symptoms: Negative for: Claustrophobia; Suicidal Medical History: Negative for: Anorexia/bulimia; Confinement Anxiety Past Medical History Notes: depression Hematologic/Lymphatic Endocrine Medical History: Positive for: Type II Diabetes Negative for: Type I Diabetes Time with diabetes: since 2016 Treated with: Insulin, Oral agents Blood sugar tested every day: No Immunological Oncologic Medical History: Negative for: Received Chemotherapy; Received Radiation Immunizations Pneumococcal Vaccine: Received Pneumococcal Vaccination: No Implantable Devices None Hospitalization / Surgery History Type of Hospitalization/Surgery tonsillectomy tubes in ears nasal reconstruction Family and Social History Cancer: Yes - Mother; Diabetes: No; Heart Disease: Yes - Mother; Hereditary Spherocytosis: No; Hypertension: Yes - Mother; Kidney Disease: No; Lung Disease: No; Seizures: No; Stroke: No; Thyroid Problems: No; Tuberculosis: No; Current every day smoker - 1/2 ppd; Marital Status - Single; Alcohol Use: Daily - beer; Drug Use: No History; Caffeine Use: Daily - coffee, tea, soda; Financial Concerns: No; Food, Clothing or Shelter Needs: No; Support System Lacking: No; Transportation Concerns: No Engineer, maintenance) Signed: 06/04/2020 4:14:31 PM By: Marc Gouty RN, BSN Signed: 06/04/2020 5:03:25 PM By: Marc Ham MD Entered By: Marc Galloway  on 06/04/2020 15:05:53 -------------------------------------------------------------------------------- Fries Details Patient Name: Date of Service: Marc Galloway Marc E. 06/04/2020 Medical Record Number: [id number] Patient Account Number: [id number] Date of Birth/Sex: Treating RN: July 09, 1975 (44 y.o. Male) Marc Galloway Primary Care Provider: Jonathon Galloway Other Clinician: Referring Provider: Treating Provider/Extender: Marc Galloway in Treatment: 0 Diagnosis Coding ICD-10 Codes Code Description [phone] Non-pressure chronic ulcer of skin of other sites with other specified severity L72.3 Sebaceous cyst Facility Procedures CPT4 Code: [id number] Description: WOUND CARE VISIT-LEV 3 NEW PT Modifier: 25 Quantity: 1 CPT4 Code: [id number] Description: 12751 - CHEM CAUT GRANULATION TISS ICD-10 Diagnosis Description L98.498 Non-pressure chronic ulcer of skin of other sites with other specified severity Modifier: Quantity: 1 Physician Procedures : CPT4 Code Description Modifier [id number] - WC PHYS LEVEL 2 - NEW PT 25 ICD-10 Diagnosis Description L98.498 Non-pressure chronic ulcer of skin of other sites with other specified severity L72.3 Sebaceous cyst Quantity: 1 : [id number] - WC PHYS CHEM CAUT GRAN TISSUE ICD-10 Diagnosis Description L98.498 Non-pressure chronic ulcer of skin of other sites with other specified severity Quantity: 1 Electronic Signature(s) Signed: 06/04/2020 5:03:25 PM By: Marc Ham MD Signed: 06/04/2020 5:12:11 PM By: Marc Hurst RN, BSN Entered By: Marc Galloway on 06/04/2020 16:47:51

## 2020-06-04 NOTE — Progress Notes (Signed)
Marc Galloway, Marc Galloway ([id number]) Visit Report for 06/04/2020 Abuse/Suicide Risk Screen Details Patient Name: Date of Service: Marc Galloway, Marc [name] 06/04/2020 2:45 PM Medical Record Number: [id number] Patient Account Number: [id number] Date of Birth/Sex: Treating RN: Marc Galloway, Marc Galloway (45 y.o. Male) Zenaida Deed Primary Care Chiron Campione: [name] Other Clinician: Referring Reeya Bound: Treating Kemani Demarais/Extender: Francina Ames in Treatment: 0 Abuse/Suicide Risk Screen Items Answer ABUSE RISK SCREEN: Has anyone close to you tried to hurt or harm you recentlyo No Do you feel uncomfortable with anyone in your familyo No Has anyone forced you do things that you didnt want to doo No Electronic Signature(s) Signed: 06/04/2020 4:14:31 PM By: Zenaida Deed RN, BSN Entered By: Zenaida Deed on Galloway/21/2022 15:06:00 -------------------------------------------------------------------------------- Activities of Daily Living Details Patient Name: Date of Service: Marc Galloway, Marc [name] 06/04/2020 2:45 PM Medical Record Number: [id number] Patient Account Number: [id number] Date of Birth/Sex: Treating RN: Marc Galloway (44 y.o. Male) Zenaida Deed Primary Care Ming Mcmannis: [name] Other Clinician: Referring Kensie Susman: Treating Karynn Deblasi/Extender: Francina Ames in Treatment: 0 Activities of Daily Living Items Answer Activities of Daily Living (Please select one for each item) Drive Automobile Completely Able T Medications ake Completely Able Use T elephone Completely Able Care for Appearance Completely Able Use T oilet Completely Able Bath / Shower Completely Able Dress Self Completely Able Feed Self Completely Able Walk Completely Able Get In / Out Bed Completely Able Housework Completely Able Prepare Meals Completely Able Handle Money Completely Able Shop for Self Completely Able Electronic Signature(s) Signed: 06/04/2020 4:14:31 PM By: Zenaida Deed RN,  BSN Entered By: Zenaida Deed on Galloway/21/2022 15:06:17 -------------------------------------------------------------------------------- Education Screening Details Patient Name: Date of Service: Marc Pro [name] 06/04/2020 2:45 PM Medical Record Number: [id number] Patient Account Number: [id number] Date of Birth/Sex: Treating RN: Marc Galloway (44 y.o. Male) Zenaida Deed Primary Care Shanard Treto: [name] Other Clinician: Referring Niyana Chesbro: Treating Janace Decker/Extender: Francina Ames in Treatment: 0 Primary Learner Assessed: Patient Learning Preferences/Education Level/Primary Language Learning Preference: Explanation, Demonstration, Printed Material Highest Education Level: High School Preferred Language: English Cognitive Barrier Language Barrier: No Translator Needed: No Memory Deficit: No Emotional Barrier: No Cultural/Religious Beliefs Affecting Medical Care: No Physical Barrier Impaired Vision: Yes Glasses Impaired Hearing: No Decreased Hand dexterity: No Knowledge/Comprehension Knowledge Level: High Comprehension Level: High Ability to understand written instructions: High Ability to understand verbal instructions: High Motivation Anxiety Level: Calm Cooperation: Cooperative Education Importance: Acknowledges Need Interest in Health Problems: Asks Questions Perception: Coherent Willingness to Engage in Self-Management High Activities: Readiness to Engage in Self-Management High Activities: Electronic Signature(s) Signed: 06/04/2020 4:14:31 PM By: Zenaida Deed RN, BSN Entered By: Zenaida Deed on Galloway/21/2022 15:06:44 -------------------------------------------------------------------------------- Fall Risk Assessment Details Patient Name: Date of Service: Marc Galloway, Marc [name] 06/04/2020 2:45 PM Medical Record Number: [id number] Patient Account Number: [id number] Date of Birth/Sex: Treating RN: Marc Galloway, Marc Galloway (44 y.o. Male) Zenaida Deed Primary Care Leya Paige: [name] Other Clinician: Referring Julliana Whitmyer: Treating Makiya Jeune/Extender: Francina Ames in Treatment: 0 Fall Risk Assessment Items Have you had 2 or more falls in the last 12 monthso 0 Yes Have you had any fall that resulted in injury in the last 12 monthso 0 No FALLS RISK SCREEN History of falling - immediate or within 3 months 0 No Secondary diagnosis (Do you have 2 or more medical diagnoseso) 0 No Ambulatory aid None/bed rest/wheelchair/nurse 0 No Crutches/cane/walker 15 Yes Furniture 0 No Intravenous therapy Access/Saline/Heparin Lock 0 No Gait/Transferring Normal/ bed rest/ wheelchair 0 Yes Weak (short steps  with or without shuffle, stooped but able to lift head while walking, may seek 0 No support from furniture) Impaired (short steps with shuffle, may have difficulty arising from chair, head down, impaired 0 No balance) Mental Status Oriented to own ability 0 Yes Electronic Signature(s) Signed: 06/04/2020 4:14:31 PM By: Zenaida Deed RN, BSN Entered By: Zenaida Deed on Galloway/21/2022 15:07:24 -------------------------------------------------------------------------------- Foot Assessment Details Patient Name: Date of Service: Marc Pro [name] 06/04/2020 2:45 PM Medical Record Number: [id number] Patient Account Number: [id number] Date of Birth/Sex: Treating RN: Marc Galloway (44 y.o. Male) Zenaida Deed Primary Care Soloman Mckeithan: [name] Other Clinician: Referring Pretty Weltman: Treating Simran Mannis/Extender: Francina Ames in Treatment: 0 Foot Assessment Items Site Locations + = Sensation present, - = Sensation absent, C = Callus, U = Ulcer R = Redness, W = Warmth, M = Maceration, PU = Pre-ulcerative lesion F = Fissure, S = Swelling, D = Dryness Assessment Right: Left: Other Deformity: No No Prior Foot Ulcer: No No Prior Amputation: No No Charcot Joint: No No Ambulatory Status:  Ambulatory With Help Assistance Device: Cane Gait: Steady Electronic Signature(s) Signed: 06/04/2020 4:14:31 PM By: Zenaida Deed RN, BSN Entered By: Zenaida Deed on Galloway/21/2022 15:08:47 -------------------------------------------------------------------------------- Nutrition Risk Screening Details Patient Name: Date of Service: Marc Pro [name] 06/04/2020 2:45 PM Medical Record Number: [id number] Patient Account Number: [id number] Date of Birth/Sex: Treating RN: Marc Galloway, Marc Galloway (44 y.o. Male) Zenaida Deed Primary Care March Steyer: [name] Other Clinician: Referring Deneene Tarver: Treating Mayte Diers/Extender: Francina Ames in Treatment: 0 Height (in): 65 Weight (lbs): 256 Body Mass Index (BMI): 42.6 Nutrition Risk Screening Items Score Screening NUTRITION RISK SCREEN: I have an illness or condition that made me change the kind and/or amount of food I eat 0 No I eat fewer than two meals per day 0 No I eat few fruits and vegetables, or milk products 0 No I have three or more drinks of beer, liquor or wine almost every day 2 Yes I have tooth or mouth problems that make it hard for me to eat 0 No I don't always have enough money to buy the food I need 0 No I eat alone most of the time 0 No I take three or more different prescribed or over-the-counter drugs Marc day 1 Yes Without wanting to, I have lost or gained 10 pounds in the last six months 0 No I am not always physically able to shop, cook and/or feed myself 0 No Nutrition Protocols Good Risk Protocol Provide education on elevated blood Moderate Risk Protocol 0 sugars and impact on wound healing, as applicable High Risk Proctocol Risk Level: Moderate Risk Score: 3 Electronic Signature(s) Signed: 06/04/2020 4:14:31 PM By: Zenaida Deed RN, BSN Entered By: Zenaida Deed on Galloway/21/2022 15:08:Galloway

## 2020-06-05 DIAGNOSIS — S21302A Unspecified open wound of left front wall of thorax with penetration into thoracic cavity, initial encounter: Secondary | ICD-10-CM | POA: Diagnosis not present

## 2020-06-05 NOTE — Progress Notes (Signed)
DWAN, HEMMELGARN ([id number]) Visit Report for 06/04/2020 Allergy List Details Patient Name: Date of Service: ANOOP, HEMMER [name] 06/04/2020 2:45 PM Medical Record Number: [id number] Patient Account Number: [id number] Date of Birth/Sex: Treating RN: 1975-05-22 (45 y.o. Male) Baruch Gouty Primary Care Cheyenna Pankowski: Jonathon Jordan Other Clinician: Referring Dorothe Elmore: Treating Carold Eisner/Extender: Cheri Guppy in Treatment: 0 Allergies Active Allergies metformin Reaction: agitation/aggression Actos Reaction: CHF Allergy Notes Electronic Signature(s) Signed: 06/04/2020 4:14:31 PM By: Baruch Gouty RN, BSN Entered By: Baruch Gouty on 06/04/2020 14:56:32 -------------------------------------------------------------------------------- [place] Details Patient Name: Date of Service: Fredna Dow [name] 06/04/2020 2:45 PM Medical Record Number: [id number] Patient Account Number: [id number] Date of Birth/Sex: Treating RN: October 10, 1975 (44 y.o. Male) Baruch Gouty Primary Care Idamae Coccia: Jonathon Jordan Other Clinician: Referring Regina Ganci: Treating Linda Grimmer/Extender: Cheri Guppy in Treatment: 0 Visit Information Patient Arrived: Lyndel Pleasure Time: 14:47 Accompanied By: self Transfer Assistance: None Patient Identification Verified: Yes Secondary Verification Process Completed: Yes Patient Requires Transmission-Based Precautions: No Patient Has Alerts: No Electronic Signature(s) Signed: 06/04/2020 4:14:31 PM By: Baruch Gouty RN, BSN Entered By: Baruch Gouty on 06/04/2020 14:51:51 -------------------------------------------------------------------------------- Clinic Level of Care Assessment Details Patient Name: Date of Service: GRAYSYN, BACHE [name] 06/04/2020 2:45 PM Medical Record Number: [id number] Patient Account Number: [id number] Date of Birth/Sex: Treating RN: 1975/10/23 (44 y.o. Male) Levan Hurst Primary Care Tino Ronan:  Jonathon Jordan Other Clinician: Referring Broox Lonigro: Treating Sydney Azure/Extender: Cheri Guppy in Treatment: 0 Clinic Level of Care Assessment Items TOOL 1 Quantity Score X- 1 0 Use when EandM and Procedure is performed on INITIAL visit ASSESSMENTS - Nursing Assessment / Reassessment X- 1 20 General Physical Exam (combine w/ comprehensive assessment (listed just below) when performed on new pt. evals) X- 1 25 Comprehensive Assessment (HX, ROS, Risk Assessments, Wounds Hx, etc.) ASSESSMENTS - Wound and Skin Assessment / Reassessment []  - 0 Dermatologic / Skin Assessment (not related to wound area) ASSESSMENTS - Ostomy and/or Continence Assessment and Care []  - 0 Incontinence Assessment and Management []  - 0 Ostomy Care Assessment and Management (repouching, etc.) PROCESS - Coordination of Care X - Simple Patient / Family Education for ongoing care 1 15 []  - 0 Complex (extensive) Patient / Family Education for ongoing care X- 1 10 Staff obtains Programmer, systems, Records, T Results / Process Orders est []  - 0 Staff telephones HHA, Nursing Homes / Clarify orders / etc []  - 0 Routine Transfer to another Facility (non-emergent condition) []  - 0 Routine Hospital Admission (non-emergent condition) X- 1 15 New Admissions / Biomedical engineer / Ordering NPWT Apligraf, etc. , []  - 0 Emergency Hospital Admission (emergent condition) PROCESS - Special Needs []  - 0 Pediatric / Minor Patient Management []  - 0 Isolation Patient Management []  - 0 Hearing / Language / Visual special needs []  - 0 Assessment of Community assistance (transportation, D/C planning, etc.) []  - 0 Additional assistance / Altered mentation []  - 0 Support Surface(s) Assessment (bed, cushion, seat, etc.) INTERVENTIONS - Miscellaneous []  - 0 External ear exam []  - 0 Patient Transfer (multiple staff / Civil Service fast streamer / Similar devices) []  - 0 Simple Staple / Suture removal (25 or  less) []  - 0 Complex Staple / Suture removal (26 or more) []  - 0 Hypo/Hyperglycemic Management (do not check if billed separately) []  - 0 Ankle / Brachial Index (ABI) - do not check if billed separately Has the patient been seen at the hospital within the last three years: Yes Total Score: 85 Level Of Care:  New/Established - Level 3 Electronic Signature(s) Signed: 06/04/2020 5:12:11 PM By: Levan Hurst RN, BSN Signed: 06/04/2020 5:12:11 PM By: Levan Hurst RN, BSN Entered By: Levan Hurst on 06/04/2020 16:46:42 -------------------------------------------------------------------------------- Encounter Discharge Information Details Patient Name: Date of Service: [place]berg, [name] 06/04/2020 2:45 PM Medical Record Number: [id number] Patient Account Number: [id number] Date of Birth/Sex: Treating RN: February 26, 1976 (44 y.o. Male) Deon Pilling Primary Care Chauna Osoria: Jonathon Jordan Other Clinician: Referring Min Tunnell: Treating Cathyrn Deas/Extender: Cheri Guppy in Treatment: 0 Encounter Discharge Information Items Post Procedure Vitals Discharge Condition: Stable Temperature (F): 98.8 Ambulatory Status: Cane Pulse (bpm): 99 Discharge Destination: Home Respiratory Rate (breaths/min): 20 Transportation: Private Auto Blood Pressure (mmHg): 154/101 Accompanied By: self Schedule Follow-up Appointment: Yes Clinical Summary of Care: Notes Educated patient how to care for wound, dressing, frequency, and when to return to wound center. Patient in agreement. Electronic Signature(s) Signed: 06/04/2020 4:58:53 PM By: Deon Pilling Entered By: Deon Pilling on 06/04/2020 16:17:23 -------------------------------------------------------------------------------- Lower Extremity Assessment Details Patient Name: Date of Service: DOIS, JUARBE [name] 06/04/2020 2:45 PM Medical Record Number: [id number] Patient Account Number: [id number] Date of Birth/Sex: Treating RN: [birth year]  (44 y.o. Male) Baruch Gouty Primary Care Sylvanus Telford: Jonathon Jordan Other Clinician: Referring Kriti Katayama: Treating Ernie Sagrero/Extender: Cheri Guppy in Treatment: 0 Electronic Signature(s) Signed: 06/04/2020 4:14:31 PM By: Baruch Gouty RN, BSN Entered By: Baruch Gouty on 06/04/2020 15:08:54 -------------------------------------------------------------------------------- Multi Wound Chart Details Patient Name: Date of Service: Fredna Dow [name] 06/04/2020 2:45 PM Medical Record Number: [id number] Patient Account Number: [id number] Date of Birth/Sex: Treating RN: October 29, 1975 (44 y.o. Male) Levan Hurst Primary Care Billie Intriago: Jonathon Jordan Other Clinician: Referring Kooper Chriswell: Treating Aubriana Ravelo/Extender: Cheri Guppy in Treatment: 0 Vital Signs Height(in): 65 Capillary Blood Glucose(mg/dl): 187 Weight(lbs): 256 Pulse(bpm): 99 Body Mass Index(BMI): 43 Blood Pressure(mmHg): 154/101 Temperature(F): 98.8 Respiratory Rate(breaths/min): 20 Photos: [1:No Photos Left Chest] [N/A:N/A N/A] Wound Location: [1:Bump] [N/A:N/A] Wounding Event: [1:Abscess] [N/A:N/A] Primary Etiology: [1:Sleep Apnea, Congestive Heart] [N/A:N/A] Comorbid History: [1:Failure, Hypertension, Type II Diabetes, Osteoarthritis, Neuropathy 03/26/2020] [N/A:N/A] Date Acquired: [1:0] [N/A:N/A] Weeks of Treatment: [1:Open] [N/A:N/A] Wound Status: [1:0.6x1x1] [N/A:N/A] Measurements L x W x D (cm) [1:0.471] [N/A:N/A] A (cm) : rea [1:0.471] [N/A:N/A] Volume (cm) : [1:12] Position 1 (o'clock): [1:1.7] Maximum Distance 1 (cm): [1:Yes] [N/A:N/A] Tunneling: [1:Full Thickness Without Exposed] [N/A:N/A] Classification: [1:Support Structures Medium] [N/A:N/A] Exudate A mount: [1:Serosanguineous] [N/A:N/A] Exudate Type: [1:red, brown] [N/A:N/A] Exudate Color: [1:Distinct, outline attached] [N/A:N/A] Wound Margin: [1:Large (67-100%)] [N/A:N/A] Granulation A mount:  [1:Red, Friable] [N/A:N/A] Granulation Quality: [1:None Present (0%)] [N/A:N/A] Necrotic A mount: [1:Fat Layer (Subcutaneous Tissue): Yes N/A] Exposed Structures: [1:Fascia: No Tendon: No Muscle: No Joint: No Bone: No Small (1-33%)] [N/A:N/A] Epithelialization: [1:Chemical/Enzymatic/Mechanical] [N/A:N/A] Debridement: Pre-procedure Verification/Time Out 15:50 [N/A:N/A] Taken: [1:N/A] [N/A:N/A] Instrument: [1:None] [N/A:N/A] Bleeding: [1:Procedure was tolerated well] [N/A:N/A] Debridement Treatment Response: [1:0.6x1x1] [N/A:N/A] Post Debridement Measurements L x W x D (cm) [1:0.471] [N/A:N/A] Post Debridement Volume: (cm) [1:Chemical Cauterization] [N/A:N/A] Procedures Performed: [1:Debridement] Treatment Notes Electronic Signature(s) Signed: 06/04/2020 5:03:25 PM By: Linton Ham MD Signed: 06/04/2020 5:12:11 PM By: Levan Hurst RN, BSN Entered By: Linton Ham on 06/04/2020 15:56:55 -------------------------------------------------------------------------------- Multi-Disciplinary Care Plan Details Patient Name: Date of Service: Fredna Dow [name] 06/04/2020 2:45 PM Medical Record Number: [id number] Patient Account Number: [id number] Date of Birth/Sex: Treating RN: 06-08-1975 (44 y.o. Male) Levan Hurst Primary Care Rayvn Rickerson: Jonathon Jordan Other Clinician: Referring Virlee Stroschein: Treating Kae Lauman/Extender: Cheri Guppy in Treatment: 0 Multidisciplinary Care Plan reviewed with physician Active  Inactive Nutrition Nursing Diagnoses: Impaired glucose control: actual or potential Potential for alteratiion in Nutrition/Potential for imbalanced nutrition Goals: Patient/caregiver agrees to and verbalizes understanding of need to use nutritional supplements and/or vitamins as prescribed Date Initiated: 06/04/2020 Target Resolution Date: 07/06/2020 Goal Status: Active Patient/caregiver will maintain therapeutic glucose control Date Initiated:  06/04/2020 Target Resolution Date: 07/06/2020 Goal Status: Active Interventions: Assess HgA1c results as ordered upon admission and as needed Assess patient nutrition upon admission and as needed per policy Provide education on elevated blood sugars and impact on wound healing Provide education on nutrition Notes: Wound/Skin Impairment Nursing Diagnoses: Impaired tissue integrity Knowledge deficit related to ulceration/compromised skin integrity Goals: Patient will demonstrate a reduced rate of smoking or cessation of smoking Date Initiated: 06/04/2020 Target Resolution Date: 07/06/2020 Goal Status: Active Patient/caregiver will verbalize understanding of skin care regimen Date Initiated: 06/04/2020 Target Resolution Date: 07/06/2020 Goal Status: Active Ulcer/skin breakdown will have a volume reduction of 30% by week 4 Date Initiated: 06/04/2020 Target Resolution Date: 07/06/2020 Goal Status: Active Interventions: Assess patient/caregiver ability to obtain necessary supplies Assess patient/caregiver ability to perform ulcer/skin care regimen upon admission and as needed Assess ulceration(s) every visit Provide education on smoking Provide education on ulcer and skin care Notes: Electronic Signature(s) Signed: 06/04/2020 5:12:11 PM By: Levan Hurst RN, BSN Entered By: Levan Hurst on 06/04/2020 15:48:01 -------------------------------------------------------------------------------- Pain Assessment Details Patient Name: Date of Service: Fredna Dow [name] 06/04/2020 2:45 PM Medical Record Number: [id number] Patient Account Number: [id number] Date of Birth/Sex: Treating RN: March 28, 1975 (44 y.o. Male) Baruch Gouty Primary Care Daiveon Markman: Jonathon Jordan Other Clinician: Referring Broderic Bara: Treating Stephen Baruch/Extender: Cheri Guppy in Treatment: 0 Active Problems Location of Pain Severity and Description of Pain Patient Has Paino No Site Locations Rate  the pain. Current Pain Level: 0 Pain Management and Medication Current Pain Management: Electronic Signature(s) Signed: 06/04/2020 4:14:31 PM By: Baruch Gouty RN, BSN Entered By: Baruch Gouty on 06/04/2020 15:20:44 -------------------------------------------------------------------------------- Patient/Caregiver Education Details Patient Name: Date of Service: Wyline Copas 3/21/2022andnbsp2:45 PM Medical Record Number: [id number] Patient Account Number: [id number] Date of Birth/Gender: Treating RN: 01/26/1976 (44 y.o. Male) Levan Hurst Primary Care Physician: Jonathon Jordan Other Clinician: Referring Physician: Treating Physician/Extender: Cheri Guppy in Treatment: 0 Education Assessment Education Provided To: Patient Education Topics Provided Elevated Blood Sugar/ Impact on Healing: Methods: Explain/Verbal Responses: State content correctly Nutrition: Methods: Explain/Verbal Responses: State content correctly Smoking and Wound Healing: Methods: Explain/Verbal Responses: State content correctly Wound/Skin Impairment: Methods: Explain/Verbal Responses: State content correctly Electronic Signature(s) Signed: 06/04/2020 5:12:11 PM By: Levan Hurst RN, BSN Entered By: Levan Hurst on 06/04/2020 16:46:21 -------------------------------------------------------------------------------- Wound Assessment Details Patient Name: Date of Service: Fredna Dow [name] 06/04/2020 2:45 PM Medical Record Number: [id number] Patient Account Number: [id number] Date of Birth/Sex: Treating RN: June 24, 1975 (44 y.o. Male) Baruch Gouty Primary Care Naiah Donahoe: Jonathon Jordan Other Clinician: Referring Deliana Avalos: Treating Christena Sunderlin/Extender: Cheri Guppy in Treatment: 0 Wound Status Wound Number: 1 Primary Abscess Etiology: Wound Location: Left Chest Wound Open Wounding Event: Bump Status: Date Acquired: 03/26/2020 Comorbid  Sleep Apnea, Congestive Heart Failure, Hypertension, Type II Weeks Of Treatment: 0 History: Diabetes, Osteoarthritis, Neuropathy Clustered Wound: No Photos Wound Measurements Length: (cm) 0.6 Width: (cm) 1 Depth: (cm) 1 Area: (cm) 0.471 Volume: (cm) 0.471 % Reduction in Area: 0% % Reduction in Volume: 0% Epithelialization: Small (1-33%) Tunneling: Yes Position (o'clock): 12 Maximum Distance: (cm) 1.7 Undermining: No Wound Description Classification: Full Thickness Without Exposed Support Structures Wound Margin:  Distinct, outline attached Exudate Amount: Medium Exudate Type: Serosanguineous Exudate Color: red, brown Foul Odor After Cleansing: No Slough/Fibrino No Wound Bed Granulation Amount: Large (67-100%) Exposed Structure Granulation Quality: Red, Friable Fascia Exposed: No Necrotic Amount: None Present (0%) Fat Layer (Subcutaneous Tissue) Exposed: Yes Tendon Exposed: No Muscle Exposed: No Joint Exposed: No Bone Exposed: No Treatment Notes Wound #1 (Chest) Wound Laterality: Left Cleanser [place] Ancillary Kit - 15 Day Supply Discharge Instruction: Use supplies as instructed; Kit contains: (15) Saline Bullets; (15) 3x3 Gauze; 15 pr Gloves Peri-Wound Care Skin Prep Discharge Instruction: Use skin prep as directed Topical Primary Dressing KerraCel Ag Gelling Fiber Dressing, 2x2 in (silver alginate) Discharge Instruction: Apply silver alginate to wound bed as instructed Secondary Dressing Bordered Gauze, 4x4 in Discharge Instruction: Apply over primary dressing as directed. Secured With Compression Wrap Compression Stockings Environmental education officer) Signed: 06/04/2020 4:46:01 PM By: Sandre Kitty Signed: 06/05/2020 5:27:17 PM By: Baruch Gouty RN, BSN Previous Signature: 06/04/2020 4:14:31 PM Version By: Baruch Gouty RN, BSN Entered By: Sandre Kitty on 06/04/2020  16:16:44 -------------------------------------------------------------------------------- Vitals Details Patient Name: Date of Service: Karnik, [name] 06/04/2020 2:45 PM Medical Record Number: [id number] Patient Account Number: [id number] Date of Birth/Sex: Treating RN: 1976-01-16 (44 y.o. Male) Baruch Gouty Primary Care Samar Dass: Jonathon Jordan Other Clinician: Referring Anarie Kalish: Treating Tava Peery/Extender: Cheri Guppy in Treatment: 0 Vital Signs Time Taken: 14:52 Temperature (F): 98.8 Height (in): 65 Pulse (bpm): 99 Source: Stated Respiratory Rate (breaths/min): 20 Weight (lbs): 256 Blood Pressure (mmHg): 154/101 Source: Stated Capillary Blood Glucose (mg/dl): 187 Body Mass Index (BMI): 42.6 Reference Range: 80 - 120 mg / dl Notes glucose per pt report yesterday am Electronic Signature(s) Signed: 06/04/2020 4:14:31 PM By: Baruch Gouty RN, BSN Entered By: Baruch Gouty on 06/04/2020 14:56:52

## 2020-06-11 ENCOUNTER — Other Ambulatory Visit: Payer: Self-pay

## 2020-06-11 ENCOUNTER — Encounter (HOSPITAL_BASED_OUTPATIENT_CLINIC_OR_DEPARTMENT_OTHER): Payer: Medicare Other | Admitting: Internal Medicine

## 2020-06-11 DIAGNOSIS — L98498 Non-pressure chronic ulcer of skin of other sites with other specified severity: Secondary | ICD-10-CM | POA: Diagnosis not present

## 2020-06-11 DIAGNOSIS — E11610 Type 2 diabetes mellitus with diabetic neuropathic arthropathy: Secondary | ICD-10-CM | POA: Diagnosis not present

## 2020-06-11 DIAGNOSIS — G4733 Obstructive sleep apnea (adult) (pediatric): Secondary | ICD-10-CM | POA: Diagnosis not present

## 2020-06-11 DIAGNOSIS — E11622 Type 2 diabetes mellitus with other skin ulcer: Secondary | ICD-10-CM | POA: Diagnosis not present

## 2020-06-11 DIAGNOSIS — F17210 Nicotine dependence, cigarettes, uncomplicated: Secondary | ICD-10-CM | POA: Diagnosis not present

## 2020-06-11 DIAGNOSIS — L98492 Non-pressure chronic ulcer of skin of other sites with fat layer exposed: Secondary | ICD-10-CM | POA: Diagnosis not present

## 2020-06-11 DIAGNOSIS — I10 Essential (primary) hypertension: Secondary | ICD-10-CM | POA: Diagnosis not present

## 2020-06-11 NOTE — Progress Notes (Signed)
Marc, Galloway ([id number]) Visit Report for 06/11/2020 HPI Details Patient Name: Date of Service: Marc Galloway, Marc Marc E. 06/11/2020 2:15 PM Medical Record Number: [id number] Patient Account Number: [id number] Date of Birth/Sex: Treating RN: [birth year] (45 y.o. Marc Galloway Primary Care Provider: Jonathon Galloway Other Clinician: Referring Provider: Treating Provider/Extender: Cheri Guppy in Treatment: 1 History of Present Illness HPI Description: ADMISSION 06/04/2020 This is Marc 45 year old man who is Marc type II diabetic. Apparently in January noticed some itching on his anterior upper chest scratched incessantly and then opened the wound. According to his primary doctor's notes from 04/20/2020 this presented as Marc cyst that either popped or was I indeed. He was placed on antibiotics doxycycline. The patient thinks Marc culture that was done that was negative although there was Marc fair amount of drainage. Fortunately the area never really healed. He now has an open area roughly 1 cm in depth and 1.7 cm of undermining at roughly 12:00 Past medical history includes type 2 diabetes, alcohol use, tobacco abuse, left ankle wound, depression, obstructive sleep apnea, hypertension, hyperlipidemia, left ankle Charcot deformity 3/28; abscess or cyst on the left upper chest. This is come in from an undermining area of 1.7-1 this is improved overall circumference seems better. We have been using silver alginate. No evidence of infection Electronic Signature(s) Signed: 06/11/2020 5:22:38 PM By: Linton Ham MD Entered By: Linton Ham on 06/11/2020 15:11:41 -------------------------------------------------------------------------------- Physical Exam Details Patient Name: Date of Service: Marc Galloway Marc E. 06/11/2020 2:15 PM Medical Record Number: [id number] Patient Account Number: [id number] Date of Birth/Sex: Treating RN: 31-May-1975 (45 y.o. Marc Galloway Primary Care Provider:  Jonathon Galloway Other Clinician: Referring Provider: Treating Provider/Extender: Cheri Guppy in Treatment: 1 Constitutional Patient is hypertensive.. Pulse regular and within target range for patient.[name] Respirations regular, non-labored and within target range.. Temperature is normal and within the target range for the patient.[name] Appears in no distress. Notes Wound exam; small open area on the upper chest. Undermining of 1.7 last week now at 1 direct depth is also come in Marc bit. The undermining is at 12:00. This does not go to bone there is no surrounding infection no tenderness. Overall appearance is improved Electronic Signature(s) Signed: 06/11/2020 5:22:38 PM By: Linton Ham MD Entered By: Linton Ham on 06/11/2020 15:12:36 -------------------------------------------------------------------------------- Physician Orders Details Patient Name: Date of Service: Marc Galloway Marc E. 06/11/2020 2:15 PM Medical Record Number: [id number] Patient Account Number: [id number] Date of Birth/Sex: Treating RN: 02-Sep-1975 (45 y.o. Marc Galloway Primary Care Provider: Jonathon Galloway Other Clinician: Referring Provider: Treating Provider/Extender: Cheri Guppy in Treatment: 1 Verbal / Phone Orders: No Diagnosis Coding ICD-10 Coding Code Description [phone] Non-pressure chronic ulcer of skin of other sites with other specified severity L72.3 Sebaceous cyst Follow-up Appointments Return Appointment in 1 week. Bathing/ Shower/ Hygiene May shower and wash wound with soap and water. - on days that dressing is changed Wound Treatment Wound #1 - Chest Wound Laterality: Left Cleanser: Byram Ancillary Kit - 15 Day Supply (Generic) Every Other Day/15 Days Discharge Instructions: Use supplies as instructed; Kit contains: (15) Saline Bullets; (15) 3x3 Gauze; 15 pr Gloves Peri-Wound Care: Skin Prep (Generic) Every Other Day/15 Days Discharge  Instructions: Use skin prep as directed Prim Dressing: KerraCel Ag Gelling Fiber Dressing, 2x2 in (silver alginate) (Generic) Every Other Day/15 Days ary Discharge Instructions: Apply silver alginate to wound bed as instructed Secondary Dressing: Bordered Gauze, 4x4 in (Generic) Every Other Day/15 Days Discharge  Instructions: Apply over primary dressing as directed. Electronic Signature(s) Signed: 06/11/2020 5:22:38 PM By: Linton Ham MD Signed: 06/11/2020 5:28:50 PM By: Levan Hurst RN, BSN Entered By: Levan Hurst on 06/11/2020 14:50:01 -------------------------------------------------------------------------------- Problem List Details Patient Name: Date of Service: Marc Galloway, Marc Marc E. 06/11/2020 2:15 PM Medical Record Number: [id number] Patient Account Number: [id number] Date of Birth/Sex: Treating RN: [birth year] (45 y.o. Marc Galloway Primary Care Provider: Jonathon Galloway Other Clinician: Referring Provider: Treating Provider/Extender: Cheri Guppy in Treatment: 1 Active Problems ICD-10 Encounter Code Description Active Date MDM Diagnosis L98.498 Non-pressure chronic ulcer of skin of other sites with other specified severity 06/04/2020 No Yes L72.3 Sebaceous cyst 06/04/2020 No Yes Inactive Problems Resolved Problems Electronic Signature(s) Signed: 06/11/2020 5:22:38 PM By: Linton Ham MD Entered By: Linton Ham on 06/11/2020 15:10:55 -------------------------------------------------------------------------------- Progress Note Details Patient Name: Date of Service: Marc Galloway Marc E. 06/11/2020 2:15 PM Medical Record Number: [id number] Patient Account Number: [id number] Date of Birth/Sex: Treating RN: [birth year] (45 y.o. Marc Galloway Primary Care Provider: Jonathon Galloway Other Clinician: Referring Provider: Treating Provider/Extender: Cheri Guppy in Treatment: 1 Subjective History of Present Illness  (HPI) ADMISSION 06/04/2020 This is Marc 45 year old man who is Marc type II diabetic. Apparently in January noticed some itching on his anterior upper chest scratched incessantly and then opened the wound. According to his primary doctor's notes from 04/20/2020 this presented as Marc cyst that either popped or was I indeed. He was placed on antibiotics doxycycline. The patient thinks Marc culture that was done that was negative although there was Marc fair amount of drainage. Fortunately the area never really healed. He now has an open area roughly 1 cm in depth and 1.7 cm of undermining at roughly 12:00 Past medical history includes type 2 diabetes, alcohol use, tobacco abuse, left ankle wound, depression, obstructive sleep apnea, hypertension, hyperlipidemia, left ankle Charcot deformity 3/28; abscess or cyst on the left upper chest. This is come in from an undermining area of 1.7-1 this is improved overall circumference seems better. We have been using silver alginate. No evidence of infection Objective Constitutional Patient is hypertensive.. Pulse regular and within target range for patient.[name] Respirations regular, non-labored and within target range.. Temperature is normal and within the target range for the patient.[name] Appears in no distress. Vitals Time Taken: 2:17 PM, Height: 65 in, Weight: 256 lbs, BMI: 42.6, Temperature: 98.3 F, Pulse: 101 bpm, Respiratory Rate: 18 breaths/min, Blood Pressure: 182/100 mmHg. General Notes: Wound exam; small open area on the upper chest. Undermining of 1.7 last week now at 1 direct depth is also come in Marc bit. The undermining is at 12:00. This does not go to bone there is no surrounding infection no tenderness. Overall appearance is improved Integumentary (Hair, Skin) Wound #1 status is Open. Original cause of wound was Bump. The date acquired was: 03/26/2020. The wound has been in treatment 1 weeks. The wound is located on the Left Chest. The wound measures 0.6cm length x  0.9cm width x 0.6cm depth; 0.424cm^2 area and 0.254cm^3 volume. There is Fat Layer (Subcutaneous Tissue) exposed. There is undermining starting at 11:00 and ending at 1:00 with Marc maximum distance of 2cm. There is Marc medium amount of serosanguineous drainage noted. The wound margin is well defined and not attached to the wound base. There is large (67-100%) red, friable granulation within the wound bed. There is no necrotic tissue within the wound bed. Assessment Active Problems ICD-10 Non-pressure chronic ulcer of skin of  other sites with other specified severity Sebaceous cyst Plan Follow-up Appointments: Return Appointment in 1 week. Bathing/ Shower/ Hygiene: May shower and wash wound with soap and water. - on days that dressing is changed WOUND #1: - Chest Wound Laterality: Left Cleanser: Byram Ancillary Kit - 15 Day Supply (Generic) Every Other Day/15 Days Discharge Instructions: Use supplies as instructed; Kit contains: (15) Saline Bullets; (15) 3x3 Gauze; 15 pr Gloves Peri-Wound Care: Skin Prep (Generic) Every Other Day/15 Days Discharge Instructions: Use skin prep as directed Prim Dressing: KerraCel Ag Gelling Fiber Dressing, 2x2 in (silver alginate) (Generic) Every Other Day/15 Days ary Discharge Instructions: Apply silver alginate to wound bed as instructed Secondary Dressing: Bordered Gauze, 4x4 in (Generic) Every Other Day/15 Days Discharge Instructions: Apply over primary dressing as directed. 1. Left chest we continued with over alginate packing strips covered with border foam. He is changing the dressing 2. No evidence of infection no cultures were done Electronic Signature(s) Signed: 06/11/2020 5:22:38 PM By: Linton Ham MD Entered By: Linton Ham on 06/11/2020 15:15:53 -------------------------------------------------------------------------------- SuperBill Details Patient Name: Date of Service: Marc Galloway Marc E. 06/11/2020 Medical Record Number:  [id number] Patient Account Number: [id number] Date of Birth/Sex: Treating RN: 11-12-1975 (45 y.o. Marc Galloway Primary Care Provider: Jonathon Galloway Other Clinician: Referring Provider: Treating Provider/Extender: Cheri Guppy in Treatment: 1 Diagnosis Coding ICD-10 Codes Code Description [phone] Non-pressure chronic ulcer of skin of other sites with other specified severity L72.3 Sebaceous cyst Facility Procedures CPT4 Code: [id number] Description: 90211 - WOUND CARE VISIT-LEV 3 EST PT Modifier: Quantity: 1 Physician Procedures Electronic Signature(s) Signed: 06/11/2020 5:22:38 PM By: Linton Ham MD Signed: 06/11/2020 5:28:50 PM By: Levan Hurst RN, BSN Entered By: Levan Hurst on 06/11/2020 16:47:55

## 2020-06-13 ENCOUNTER — Other Ambulatory Visit: Payer: Self-pay

## 2020-06-13 ENCOUNTER — Ambulatory Visit (INDEPENDENT_AMBULATORY_CARE_PROVIDER_SITE_OTHER): Payer: Medicare Other | Admitting: Podiatry

## 2020-06-13 DIAGNOSIS — M79674 Pain in right toe(s): Secondary | ICD-10-CM | POA: Diagnosis not present

## 2020-06-13 DIAGNOSIS — L989 Disorder of the skin and subcutaneous tissue, unspecified: Secondary | ICD-10-CM

## 2020-06-13 DIAGNOSIS — M79675 Pain in left toe(s): Secondary | ICD-10-CM | POA: Diagnosis not present

## 2020-06-13 DIAGNOSIS — B351 Tinea unguium: Secondary | ICD-10-CM | POA: Diagnosis not present

## 2020-06-13 DIAGNOSIS — E0843 Diabetes mellitus due to underlying condition with diabetic autonomic (poly)neuropathy: Secondary | ICD-10-CM

## 2020-06-13 NOTE — Progress Notes (Signed)
   SUBJECTIVE Patient presents to office today complaining of elongated, thickened nails that cause pain while ambulating in shoes.  He is unable to trim his own nails. Patient is here for further evaluation and treatment.  Past Medical History:  Diagnosis Date  . Acute respiratory failure (HCC)   . Allergic rhinitis   . CAP (community acquired pneumonia)   . Cardiac disease   . CHF (congestive heart failure) (HCC)   . Depression   . Diabetes mellitus without complication (HCC)   . Disturbance, sleep   . ETOH abuse   . Hyperlipidemia   . Hypertension   . Leukocytosis   . Obesity   . OSA (obstructive sleep apnea)   . Sepsis (HCC)   . Smoker   . Tobacco abuse     OBJECTIVE General Patient is awake, alert, and oriented x 3 and in no acute distress. Derm Skin is dry and supple bilateral. Negative open lesions or macerations. Remaining integument unremarkable. Nails are tender, long, thickened and dystrophic with subungual debris, consistent with onychomycosis, 1-5 bilateral. No signs of infection noted.  Hyperkeratotic preulcerative callus lesions noted bilateral feet Vasc  DP and PT pedal pulses palpable bilaterally. Temperature gradient within normal limits.  Neuro Epicritic and protective threshold sensation grossly intact bilaterally.  Musculoskeletal Exam No symptomatic pedal deformities noted bilateral. Muscular strength within normal limits.  ASSESSMENT 1. Onychodystrophic nails 1-5 bilateral with hyperkeratosis of nails.  2. Onychomycosis of nail due to dermatophyte bilateral 3. Pain in foot bilateral 4.  Preulcerative callus lesions bilateral feet  PLAN OF CARE 1. Patient evaluated today.  2. Instructed to maintain good pedal hygiene and foot care.  3. Mechanical debridement of nails 1-5 bilaterally performed using a nail nipper. Filed with dremel without incident.  4.  Excisional debridement of the hyperkeratotic preulcerative callus tissue was also performed using a  tissue nipper without incident or bleeding  5.  Return to clinic in 3 mos.    [name], [name] [facility]  Dr. [name], [name]    [address] [place], [place] [address]                Office [phone]  Fax [phone]

## 2020-06-13 NOTE — Progress Notes (Signed)
RADWAN, COWLEY ([id number]) Visit Report for 06/11/2020 Arrival Information Details Patient Name: Date of Service: JERMINE, BIBBEE [name] 06/11/2020 2:15 PM Medical Record Number: [id number] Patient Account Number: [id number] Date of Birth/Sex: Treating RN: [birth year] (45 y.o. Marcheta Grammes Primary Care Peyten Weare: Jonathon Jordan Other Clinician: Referring Freida Nebel: Treating Laquia Rosano/Extender: Cheri Guppy in Treatment: 1 Visit Information History Since Last Visit Added or deleted any medications: No Patient Arrived: Kasandra Knudsen Any new allergies or adverse reactions: No Arrival Time: 14:13 Had a fall or experienced change in No Transfer Assistance: None activities of daily living that may affect Patient Requires Transmission-Based Precautions: No risk of falls: Patient Has Alerts: No Signs or symptoms of abuse/neglect since last visito No Hospitalized since last visit: No Implantable device outside of the clinic excluding No cellular tissue based products placed in the center since last visit: Has Dressing in Place as Prescribed: Yes Pain Present Now: No Electronic Signature(s) Signed: 06/11/2020 5:20:59 PM By: Lorrin Jackson Entered By: Lorrin Jackson on 06/11/2020 14:17:38 -------------------------------------------------------------------------------- Clinic Level of Care Assessment Details Patient Name: Date of Service: JACHAI, OKAZAKI [name] 06/11/2020 2:15 PM Medical Record Number: [id number] Patient Account Number: [id number] Date of Birth/Sex: Treating RN: 1976-01-13 (45 y.o. Janyth Contes Primary Care Tremeka Helbling: Jonathon Jordan Other Clinician: Referring Meredith Mells: Treating Melvine Julin/Extender: Cheri Guppy in Treatment: 1 Clinic Level of Care Assessment Items TOOL 4 Quantity Score X- 1 0 Use when only an EandM is performed on FOLLOW-UP visit ASSESSMENTS - Nursing Assessment / Reassessment X- 1 10 Reassessment of Co-morbidities  (includes updates in patient status) X- 1 5 Reassessment of Adherence to Treatment Plan ASSESSMENTS - Wound and Skin A ssessment / Reassessment X - Simple Wound Assessment / Reassessment - one wound 1 5 _0  - 0 Complex Wound Assessment / Reassessment - multiple wounds _1  - 0 Dermatologic / Skin Assessment (not related to wound area) ASSESSMENTS - Focused Assessment _2  - 0 Circumferential Edema Measurements - multi extremities _3  - 0 Nutritional Assessment / Counseling / Intervention _4  - 0 Lower Extremity Assessment (monofilament, tuning fork, pulses) _5  - 0 Peripheral Arterial Disease Assessment (using hand held doppler) ASSESSMENTS - Ostomy and/or Continence Assessment and Care _6  - 0 Incontinence Assessment and Management _7  - 0 Ostomy Care Assessment and Management (repouching, etc.) PROCESS - Coordination of Care X - Simple Patient / Family Education for ongoing care 1 15 _8  - 0 Complex (extensive) Patient / Family Education for ongoing care X- 1 10 Staff obtains Programmer, systems, Records, T Results / Process Orders est _9  - 0 Staff telephones HHA, Nursing Homes / Clarify orders / etc _10  - 0 Routine Transfer to another Facility (non-emergent condition) _11  - 0 Routine Hospital Admission (non-emergent condition) _12  - 0 New Admissions / Biomedical engineer / Ordering NPWT Apligraf, etc. , _13  - 0 Emergency Hospital Admission (emergent condition) X- 1 10 Simple Discharge Coordination _14  - 0 Complex (extensive) Discharge Coordination PROCESS - Special Needs _15  - 0 Pediatric / Minor Patient Management _16  - 0 Isolation Patient Management _17  - 0 Hearing / Language / Visual special needs _18  - 0 Assessment of Community assistance (transportation, D/C planning, etc.) _19  - 0 Additional assistance / Altered mentation _20  - 0 Support Surface(s) Assessment (bed, cushion, seat, etc.) INTERVENTIONS - Wound Cleansing / Measurement X - Simple Wound Cleansing - one wound 1 5 _21   - 0 Complex Wound Cleansing - multiple wounds X- 1 5 Wound Imaging (photographs - any number of wounds) _22  - 0  Wound Tracing (instead of photographs) X- 1 5 Simple Wound Measurement - one wound _0  - 0 Complex Wound Measurement - multiple wounds INTERVENTIONS - Wound Dressings X - Small Wound Dressing one or multiple wounds 1 10 _1  - 0 Medium Wound Dressing one or multiple wounds _2  - 0 Large Wound Dressing one or multiple wounds <KCLEXNTZGYFVCBSW>_9<\/QPRFFMBWGYKZLDJT>_7  - 0 Application of Medications - topical <SVXBLTJQZESPQZRA>_0<\/TMAUQJFHLKTGYBWL>_8  - 0 Application of Medications - injection INTERVENTIONS - Miscellaneous _5  - 0 External ear exam _6  - 0 Specimen Collection (cultures, biopsies, blood, body fluids, etc.) _7  - 0 Specimen(s) / Culture(s) sent or taken to Lab for analysis _8  - 0 Patient Transfer (multiple staff / Civil Service fast streamer / Similar devices) _9  - 0 Simple Staple / Suture removal (25 or less) _10  - 0 Complex Staple / Suture removal (26 or more) _11  - 0 Hypo / Hyperglycemic Management (close monitor of Blood Glucose) _12  - 0 Ankle / Brachial Index (ABI) - do not check if billed separately X- 1 5 Vital Signs Has the patient been seen at the hospital within the last three years: Yes Total Score: 85 Level Of Care: New/Established - Level 3 Electronic Signature(s) Signed: 06/11/2020 5:28:50 PM By: Levan Hurst RN, BSN Entered By: Levan Hurst on 06/11/2020 16:47:50 -------------------------------------------------------------------------------- Encounter Discharge Information Details Patient Name: Date of Service: Fredna Dow [name] 06/11/2020 2:15 PM Medical Record Number: [id number] Patient Account Number: [id number] Date of Birth/Sex: Treating RN: [birth year] (45 y.o. Marcheta Grammes Primary Care Audriella Blakeley: Jonathon Jordan Other Clinician: Referring Caasi Giglia: Treating Kylie Simmonds/Extender: Cheri Guppy in Treatment: 1 Encounter Discharge Information Items Discharge Condition: Stable Ambulatory Status:  [place] Discharge Destination: Home Transportation: Private Auto Schedule Follow-up Appointment: Yes Clinical Summary of Care: Provided on 06/11/2020 Form Type Recipient Paper Patient Patient Electronic Signature(s) Signed: 06/11/2020 3:26:10 PM By: Lorrin Jackson Entered By: Lorrin Jackson on 06/11/2020 15:26:09 -------------------------------------------------------------------------------- Lower Extremity Assessment Details Patient Name: Date of Service: DEVARIUS, NELLES [name] 06/11/2020 2:15 PM Medical Record Number: [id number] Patient Account Number: [id number] Date of Birth/Sex: Treating RN: December 29, 1975 (45 y.o. Marcheta Grammes Primary Care Shalayne Leach: Jonathon Jordan Other Clinician: Referring Elesia Pemberton: Treating Harce Volden/Extender: Cheri Guppy in Treatment: 1 Notes N/A chest wound Electronic Signature(s) Signed: 06/11/2020 5:20:59 PM By: Lorrin Jackson Entered By: Lorrin Jackson on 06/11/2020 14:21:56 -------------------------------------------------------------------------------- Multi Wound Chart Details Patient Name: Date of Service: Fredna Dow [name] 06/11/2020 2:15 PM Medical Record Number: [id number] Patient Account Number: [id number] Date of Birth/Sex: Treating RN: 04/06/1975 (45 y.o. Janyth Contes Primary Care Kelii Chittum: Jonathon Jordan Other Clinician: Referring Jusiah Aguayo: Treating Ansel Ferrall/Extender: Cheri Guppy in Treatment: 1 Vital Signs Height(in): 65 Pulse(bpm): 101 Weight(lbs): 256 Blood Pressure(mmHg): 182/100 Body Mass Index(BMI): 43 Temperature(F): 98.3 Respiratory Rate(breaths/min): 18 Photos: [1:No Photos Left Chest] [N/A:N/A N/A] Wound Location: [1:Bump] [N/A:N/A] Wounding Event: [1:Abscess] [N/A:N/A] Primary Etiology: [1:Sleep Apnea, Congestive Heart] [N/A:N/A] Comorbid History: [1:Failure, Hypertension, Type II Diabetes, Osteoarthritis, Neuropathy 03/26/2020] [N/A:N/A] Date Acquired: [1:1]  [N/A:N/A] Weeks of Treatment: [1:Open] [N/A:N/A] Wound Status: [1:0.6x0.9x0.6] [N/A:N/A] Measurements L x W x D (cm) [1:0.424] [N/A:N/A] A (cm) : rea [1:0.254] [N/A:N/A] Volume (cm) : [1:10.00%] [N/A:N/A] % Reduction in A rea: [1:46.10%] [N/A:N/A] % Reduction in Volume: [1:11] Starting Position 1 (o'clock): [1:1] Ending Position 1 (o'clock): [1:2] Maximum Distance 1 (cm): [1:Yes] [N/A:N/A] Undermining: [1:Full Thickness Without Exposed] [N/A:N/A] Classification: [1:Support Structures Medium] [N/A:N/A] Exudate Amount: [1:Serosanguineous] [N/A:N/A] Exudate Type: [1:red, brown] [N/A:N/A] Exudate Color: [1:Well defined, not attached] [N/A:N/A] Wound Margin: [1:Large (67-100%)] [N/A:N/A] Granulation Amount: [  1:Red, Friable] [N/A:N/A] Granulation Quality: [1:None Present (0%)] [N/A:N/A] Necrotic Amount: [1:Fat Layer (Subcutaneous Tissue): Yes N/A] Exposed Structures: [1:Fascia: No Tendon: No Muscle: No Joint: No Bone: No Small (1-33%)] [N/A:N/A] Treatment Notes Electronic Signature(s) Signed: 06/11/2020 5:22:38 PM By: Linton Ham MD Signed: 06/11/2020 5:28:50 PM By: Levan Hurst RN, BSN Entered By: Linton Ham on 06/11/2020 15:11:03 -------------------------------------------------------------------------------- Multi-Disciplinary Care Plan Details Patient Name: Date of Service: Fredna Dow [name] 06/11/2020 2:15 PM Medical Record Number: [id number] Patient Account Number: [id number] Date of Birth/Sex: Treating RN: [birth year] (45 y.o. Janyth Contes Primary Care Claramae Rigdon: Jonathon Jordan Other Clinician: Referring Keianna Signer: Treating Vernette Moise/Extender: Cheri Guppy in Treatment: 1 Multidisciplinary Care Plan reviewed with physician Active Inactive Nutrition Nursing Diagnoses: Impaired glucose control: actual or potential Potential for alteratiion in Nutrition/Potential for imbalanced nutrition Goals: Patient/caregiver agrees to and  verbalizes understanding of need to use nutritional supplements and/or vitamins as prescribed Date Initiated: 06/04/2020 Target Resolution Date: 07/06/2020 Goal Status: Active Patient/caregiver will maintain therapeutic glucose control Date Initiated: 06/04/2020 Target Resolution Date: 07/06/2020 Goal Status: Active Interventions: Assess HgA1c results as ordered upon admission and as needed Assess patient nutrition upon admission and as needed per policy Provide education on elevated blood sugars and impact on wound healing Provide education on nutrition Treatment Activities: Education provided on Nutrition : 06/04/2020 Notes: Wound/Skin Impairment Nursing Diagnoses: Impaired tissue integrity Knowledge deficit related to ulceration/compromised skin integrity Goals: Patient will demonstrate a reduced rate of smoking or cessation of smoking Date Initiated: 06/04/2020 Target Resolution Date: 07/06/2020 Goal Status: Active Patient/caregiver will verbalize understanding of skin care regimen Date Initiated: 06/04/2020 Target Resolution Date: 07/06/2020 Goal Status: Active Ulcer/skin breakdown will have a volume reduction of 30% by week 4 Date Initiated: 06/04/2020 Target Resolution Date: 07/06/2020 Goal Status: Active Interventions: Assess patient/caregiver ability to obtain necessary supplies Assess patient/caregiver ability to perform ulcer/skin care regimen upon admission and as needed Assess ulceration(s) every visit Provide education on smoking Provide education on ulcer and skin care Notes: Electronic Signature(s) Signed: 06/11/2020 5:28:50 PM By: Levan Hurst RN, BSN Entered By: Levan Hurst on 06/11/2020 16:47:02 -------------------------------------------------------------------------------- Pain Assessment Details Patient Name: Date of Service: Fredna Dow [name] 06/11/2020 2:15 PM Medical Record Number: [id number] Patient Account Number: [id number] Date of  Birth/Sex: Treating RN: April 24, 1975 (45 y.o. Marcheta Grammes Primary Care Layani Foronda: Jonathon Jordan Other Clinician: Referring [employer] Southwell: Treating Makyra Corprew/Extender: Cheri Guppy in Treatment: 1 Active Problems Location of Pain Severity and Description of Pain Patient Has Paino No Site Locations Pain Management and Medication Current Pain Management: Electronic Signature(s) Signed: 06/11/2020 5:20:59 PM By: Lorrin Jackson Entered By: Lorrin Jackson on 06/11/2020 14:21:34 -------------------------------------------------------------------------------- Patient/Caregiver Education Details Patient Name: Date of Service: Wyline Copas 3/28/2022andnbsp2:15 PM Medical Record Number: [id number] Patient Account Number: [id number] Date of Birth/Gender: Treating RN: 1975/05/21 (45 y.o. Janyth Contes Primary Care Physician: Jonathon Jordan Other Clinician: Referring Physician: Treating Physician/Extender: Cheri Guppy in Treatment: 1 Education Assessment Education Provided To: Patient Education Topics Provided Smoking and Wound Healing: Methods: Explain/Verbal Responses: State content correctly Wound/Skin Impairment: Methods: Explain/Verbal Responses: State content correctly Electronic Signature(s) Signed: 06/11/2020 5:28:50 PM By: Levan Hurst RN, BSN Entered By: Levan Hurst on 06/11/2020 16:47:25 -------------------------------------------------------------------------------- Wound Assessment Details Patient Name: Date of Service: Fredna Dow [name] 06/11/2020 2:15 PM Medical Record Number: [id number] Patient Account Number: [id number] Date of Birth/Sex: Treating RN: 1975/11/20 (45 y.o. Marcheta Grammes Primary Care Dedra Matsuo: Jonathon Jordan Other Clinician: Referring Jaishawn Witzke: Treating Antonious Omahoney/Extender: Dellia Nims  Gilmer Mor, Ivin Booty Weeks in Treatment: 1 Wound Status Wound Number: 1 Primary  Abscess Etiology: Wound Location: Left Chest Wound Open Wounding Event: Bump Status: Date Acquired: 03/26/2020 Comorbid Sleep Apnea, Congestive Heart Failure, Hypertension, Type II Weeks Of Treatment: 1 History: Diabetes, Osteoarthritis, Neuropathy Clustered Wound: No Photos Wound Measurements Length: (cm) 0.6 Width: (cm) 0.9 Depth: (cm) 0.6 Area: (cm) 0.424 Volume: (cm) 0.254 % Reduction in Area: 10% % Reduction in Volume: 46.1% Epithelialization: Small (1-33%) Undermining: Yes Starting Position (o'clock): 11 Ending Position (o'clock): 1 Maximum Distance: (cm) 2 Wound Description Classification: Full Thickness Without Exposed Support Structures Wound Margin: Well defined, not attached Exudate Amount: Medium Exudate Type: Serosanguineous Exudate Color: red, brown Foul Odor After Cleansing: No Slough/Fibrino No Wound Bed Granulation Amount: Large (67-100%) Exposed Structure Granulation Quality: Red, Friable Fascia Exposed: No Necrotic Amount: None Present (0%) Fat Layer (Subcutaneous Tissue) Exposed: Yes Tendon Exposed: No Muscle Exposed: No Joint Exposed: No Bone Exposed: No Treatment Notes Wound #1 (Chest) Wound Laterality: Left Cleanser [place] Ancillary Kit - 15 Day Supply Discharge Instruction: Use supplies as instructed; Kit contains: (15) Saline Bullets; (15) 3x3 Gauze; 15 pr Gloves Peri-Wound Care Skin Prep Discharge Instruction: Use skin prep as directed Topical Primary Dressing KerraCel Ag Gelling Fiber Dressing, 2x2 in (silver alginate) Discharge Instruction: Apply silver alginate to wound bed as instructed Secondary Dressing Bordered Gauze, 4x4 in Discharge Instruction: Apply over primary dressing as directed. Secured With Compression Wrap Compression Stockings Environmental education officer) Signed: 06/11/2020 5:20:59 PM By: Lorrin Jackson Signed: 06/13/2020 9:05:34 AM By: Sandre Kitty Entered By: Sandre Kitty on 06/11/2020  16:30:14 -------------------------------------------------------------------------------- Vitals Details Patient Name: Date of Service: Funes, [name] 06/11/2020 2:15 PM Medical Record Number: [id number] Patient Account Number: [id number] Date of Birth/Sex: Treating RN: [birth year] (45 y.o. Marcheta Grammes Primary Care Tyller Bowlby: Jonathon Jordan Other Clinician: Referring Amberley Hamler: Treating Daine Gunther/Extender: Cheri Guppy in Treatment: 1 Vital Signs Time Taken: 14:17 Temperature (F): 98.3 Height (in): 65 Pulse (bpm): 101 Weight (lbs): 256 Respiratory Rate (breaths/min): 18 Body Mass Index (BMI): 42.6 Blood Pressure (mmHg): 182/100 Reference Range: 80 - 120 mg / dl Electronic Signature(s) Signed: 06/11/2020 5:20:59 PM By: Lorrin Jackson Entered By: Lorrin Jackson on 06/11/2020 14:17:59

## 2020-06-18 ENCOUNTER — Encounter (HOSPITAL_BASED_OUTPATIENT_CLINIC_OR_DEPARTMENT_OTHER): Payer: Medicare Other | Attending: Internal Medicine | Admitting: Internal Medicine

## 2020-06-18 ENCOUNTER — Other Ambulatory Visit (HOSPITAL_COMMUNITY)
Admission: RE | Admit: 2020-06-18 | Discharge: 2020-06-18 | Disposition: A | Discharge status: Home / Self Care | Payer: Medicare Other | Source: Other Acute Inpatient Hospital | Attending: Internal Medicine | Admitting: Internal Medicine

## 2020-06-18 DIAGNOSIS — I509 Heart failure, unspecified: Secondary | ICD-10-CM | POA: Insufficient documentation

## 2020-06-18 DIAGNOSIS — E785 Hyperlipidemia, unspecified: Secondary | ICD-10-CM | POA: Diagnosis not present

## 2020-06-18 DIAGNOSIS — I110 Hypertensive heart disease with heart failure: Secondary | ICD-10-CM | POA: Insufficient documentation

## 2020-06-18 DIAGNOSIS — F17210 Nicotine dependence, cigarettes, uncomplicated: Secondary | ICD-10-CM | POA: Diagnosis not present

## 2020-06-18 DIAGNOSIS — E1140 Type 2 diabetes mellitus with diabetic neuropathy, unspecified: Secondary | ICD-10-CM | POA: Insufficient documentation

## 2020-06-18 DIAGNOSIS — Z8249 Family history of ischemic heart disease and other diseases of the circulatory system: Secondary | ICD-10-CM | POA: Diagnosis not present

## 2020-06-18 DIAGNOSIS — L98498 Non-pressure chronic ulcer of skin of other sites with other specified severity: Secondary | ICD-10-CM | POA: Insufficient documentation

## 2020-06-18 DIAGNOSIS — L723 Sebaceous cyst: Secondary | ICD-10-CM | POA: Insufficient documentation

## 2020-06-18 DIAGNOSIS — Z6841 Body Mass Index (BMI) 40.0 and over, adult: Secondary | ICD-10-CM | POA: Insufficient documentation

## 2020-06-18 NOTE — Progress Notes (Signed)
Marc Galloway, Marc Galloway ([id number]) Visit Report for 06/18/2020 HPI Details Patient Name: Date of Service: Marc Galloway, Marc [name] 06/18/2020 2:15 PM Medical Record Number: [id number] Patient Account Number: [id number] Date of Birth/Sex: Treating RN: 1975/11/22 (45 y.o. Janyth Contes Primary Care Provider: Jonathon Jordan Other Clinician: Referring Provider: Treating Provider/Extender: Cheri Guppy in Treatment: 2 History of Present Illness HPI Description: ADMISSION 06/04/2020 This is Marc 45 year old man who is Marc type II diabetic. Apparently in January noticed some itching on his anterior upper chest scratched incessantly and then opened the wound. According to his primary doctor's notes from 04/20/2020 this presented as Marc cyst that either popped or was I indeed. He was placed on antibiotics doxycycline. The patient thinks Marc culture that was done that was negative although there was Marc fair amount of drainage. Fortunately the area never really healed. He now has an open area roughly 1 cm in depth and 1.7 cm of undermining at roughly 12:00 Past medical history includes type 2 diabetes, alcohol use, tobacco abuse, left ankle wound, depression, obstructive sleep apnea, hypertension, hyperlipidemia, left ankle Charcot deformity 3/28; abscess or cyst on the left upper chest. This is come in from an undermining area of 1.7-1 this is improved overall circumference seems better. We have been using silver alginate. No evidence of infection 4/4; abscess or cyst on the left upper chest that went underwent an IandD. I measured the undermining area last time at 1 cm although this is probably Marc bit optimistic as we got 1.7 today. The orifice is certainly smaller although this tunnels superiorly. There is no tenderness we have been using silver alginate Electronic Signature(s) Signed: 06/18/2020 4:59:32 PM By: Linton Ham MD Entered By: Linton Ham on 06/18/2020  15:08:43 -------------------------------------------------------------------------------- Physical Exam Details Patient Name: Date of Service: Marc Galloway, Marc [name] 06/18/2020 2:15 PM Medical Record Number: [id number] Patient Account Number: [id number] Date of Birth/Sex: Treating RN: 09-Sep-1975 (45 y.o. Janyth Contes Primary Care Provider: Jonathon Jordan Other Clinician: Referring Provider: Treating Provider/Extender: Cheri Guppy in Treatment: 2 Notes Wound exam; small open area in the upper chest again measuring at 1.7 cm although the circumference is certainly smaller. This does not go to bone. There does not appear to be surrounding cutaneous or soft tissue infection nothing to palpate around the wound. Electronic Signature(s) Signed: 06/18/2020 4:59:32 PM By: Linton Ham MD Entered By: Linton Ham on 06/18/2020 15:09:26 -------------------------------------------------------------------------------- Physician Orders Details Patient Name: Date of Service: Marc Galloway, Marc [name] 06/18/2020 2:15 PM Medical Record Number: [id number] Patient Account Number: [id number] Date of Birth/Sex: Treating RN: 18-Jul-1975 (45 y.o. Janyth Contes Primary Care Provider: Jonathon Jordan Other Clinician: Referring Provider: Treating Provider/Extender: Cheri Guppy in Treatment: 2 Verbal / Phone Orders: No Diagnosis Coding ICD-10 Coding Code Description [id number] Non-pressure chronic ulcer of skin of other sites with other specified severity L72.3 Sebaceous cyst Follow-up Appointments Return Appointment in 1 week. Bathing/ Shower/ Hygiene May shower and wash wound with soap and water. - on days that dressing is changed Wound Treatment Wound #1 - Chest Wound Laterality: Left Cleanser: Byram Ancillary Kit - 15 Day Supply (Generic) Every Other Day/15 Days Discharge Instructions: Use supplies as instructed; Kit contains: (15) Saline Bullets; (15) 3x3  Gauze; 15 pr Gloves Peri-Wound Care: Skin Prep (Generic) Every Other Day/15 Days Discharge Instructions: Use skin prep as directed Prim Dressing: KerraCel Ag Gelling Fiber Dressing, 2x2 in (silver alginate) (Generic) Every Other Day/15 Days ary Discharge Instructions: Apply silver  alginate to wound bed as instructed Secondary Dressing: Bordered Gauze, 4x4 in (Generic) Every Other Day/15 Days Discharge Instructions: Apply over primary dressing as directed. Laboratory naerobe culture (MICRO) - Left chest - (ICD10 L98.498 - Non-pressure chronic ulcer of skin of Bacteria identified in Unspecified specimen by Marc other sites with other specified severity) LOINC Code: 449-2 Convenience Name: Anerobic culture Electronic Signature(s) Signed: 06/18/2020 4:59:32 PM By: Linton Ham MD Signed: 06/18/2020 5:00:21 PM By: Levan Hurst RN, BSN Entered By: Levan Hurst on 06/18/2020 14:52:12 -------------------------------------------------------------------------------- Problem List Details Patient Name: Date of Service: Marc Galloway, Marc [name] 06/18/2020 2:15 PM Medical Record Number: [id number] Patient Account Number: [id number] Date of Birth/Sex: Treating RN: [birth year] (45 y.o. Janyth Contes Primary Care Provider: Jonathon Jordan Other Clinician: Referring Provider: Treating Provider/Extender: Cheri Guppy in Treatment: 2 Active Problems ICD-10 Encounter Code Description Active Date MDM Diagnosis L98.498 Non-pressure chronic ulcer of skin of other sites with other specified severity 06/04/2020 No Yes L72.3 Sebaceous cyst 06/04/2020 No Yes Inactive Problems Resolved Problems Electronic Signature(s) Signed: 06/18/2020 4:59:32 PM By: Linton Ham MD Entered By: Linton Ham on 06/18/2020 15:07:43 -------------------------------------------------------------------------------- Progress Note Details Patient Name: Date of Service: Marc Dow [name] 06/18/2020 2:15  PM Medical Record Number: [id number] Patient Account Number: [id number] Date of Birth/Sex: Treating RN: 1975-04-30 (45 y.o. Janyth Contes Primary Care Provider: Jonathon Jordan Other Clinician: Referring Provider: Treating Provider/Extender: Cheri Guppy in Treatment: 2 Subjective History of Present Illness (HPI) ADMISSION 06/04/2020 This is Marc 45 year old man who is Marc type II diabetic. Apparently in January noticed some itching on his anterior upper chest scratched incessantly and then opened the wound. According to his primary doctor's notes from 04/20/2020 this presented as Marc cyst that either popped or was I indeed. He was placed on antibiotics doxycycline. The patient thinks Marc culture that was done that was negative although there was Marc fair amount of drainage. Fortunately the area never really healed. He now has an open area roughly 1 cm in depth and 1.7 cm of undermining at roughly 12:00 Past medical history includes type 2 diabetes, alcohol use, tobacco abuse, left ankle wound, depression, obstructive sleep apnea, hypertension, hyperlipidemia, left ankle Charcot deformity 3/28; abscess or cyst on the left upper chest. This is come in from an undermining area of 1.7-1 this is improved overall circumference seems better. We have been using silver alginate. No evidence of infection 4/4; abscess or cyst on the left upper chest that went underwent an IandD. I measured the undermining area last time at 1 cm although this is probably Marc bit optimistic as we got 1.7 today. The orifice is certainly smaller although this tunnels superiorly. There is no tenderness we have been using silver alginate Objective Constitutional Vitals Time Taken: 2:18 PM, Height: 65 in, Weight: 256 lbs, BMI: 42.6, Temperature: 98.2 F, Pulse: 94 bpm, Respiratory Rate: 20 breaths/min, Blood Pressure: 148/96 mmHg. Integumentary (Hair, Skin) Wound #1 status is Open. Original cause of wound was  Bump. The date acquired was: 03/26/2020. The wound has been in treatment 2 weeks. The wound is located on the Left Chest. The wound measures 0.2cm length x 0.5cm width x 0.3cm depth; 0.079cm^2 area and 0.024cm^3 volume. There is Fat Layer (Subcutaneous Tissue) exposed. There is no tunneling noted, however, there is undermining starting at 11:00 and ending at 1:00 with Marc maximum distance of 2.2cm. There is Marc medium amount of serosanguineous drainage noted. The wound margin is well defined and not attached  to the wound base. There is large (67- 100%) red granulation within the wound bed. There is no necrotic tissue within the wound bed. Assessment Active Problems ICD-10 Non-pressure chronic ulcer of skin of other sites with other specified severity Sebaceous cyst Plan Follow-up Appointments: Return Appointment in 1 week. Bathing/ Shower/ Hygiene: May shower and wash wound with soap and water. - on days that dressing is changed Laboratory ordered were: Anerobic culture - Left chest WOUND #1: - Chest Wound Laterality: Left Cleanser: Byram Ancillary Kit - 15 Day Supply (Generic) Every Other Day/15 Days Discharge Instructions: Use supplies as instructed; Kit contains: (15) Saline Bullets; (15) 3x3 Gauze; 15 pr Gloves Peri-Wound Care: Skin Prep (Generic) Every Other Day/15 Days Discharge Instructions: Use skin prep as directed Prim Dressing: KerraCel Ag Gelling Fiber Dressing, 2x2 in (silver alginate) (Generic) Every Other Day/15 Days ary Discharge Instructions: Apply silver alginate to wound bed as instructed Secondary Dressing: Bordered Gauze, 4x4 in (Generic) Every Other Day/15 Days Discharge Instructions: Apply over primary dressing as directed. 1. I have continued with the silver alginate for another week good 2. I have recultured this for CandS but no empiric antibiotics 3. If we are not making any progress in this I might switch to [employer]) Signed:  06/18/2020 4:59:32 PM By: Linton Ham MD Entered By: Linton Ham on 06/18/2020 15:10:08 -------------------------------------------------------------------------------- SuperBill Details Patient Name: Date of Service: Marc Dow [name] 06/18/2020 Medical Record Number: [id number] Patient Account Number: [id number] Date of Birth/Sex: Treating RN: Jul 14, 1975 (45 y.o. Janyth Contes Primary Care Provider: Jonathon Jordan Other Clinician: Referring Provider: Treating Provider/Extender: Cheri Guppy in Treatment: 2 Diagnosis Coding ICD-10 Codes Code Description [id number] Non-pressure chronic ulcer of skin of other sites with other specified severity L72.3 Sebaceous cyst Facility Procedures CPT4 Code: [id number] Description: 84536 - WOUND CARE VISIT-LEV 3 EST PT Modifier: Quantity: 1 Physician Procedures Electronic Signature(s) Signed: 06/18/2020 4:59:32 PM By: Linton Ham MD Signed: 06/18/2020 5:00:21 PM By: Levan Hurst RN, BSN Entered By: Levan Hurst on 06/18/2020 15:39:50

## 2020-06-19 NOTE — Progress Notes (Signed)
DRAYLON, MERCADEL ([id number]) Visit Report for 06/18/2020 Arrival Information Details Patient Name: Date of Service: WADELL, CRADDOCK [name] 06/18/2020 2:15 PM Medical Record Number: [id number] Patient Account Number: [id number] Date of Birth/Sex: Treating RN: 02/16/1976 (45 y.o. Lorette Ang, Meta.Reding Primary Care Dandrea Medders: Jonathon Jordan Other Clinician: Referring Tersa Fotopoulos: Treating Marshay Slates/Extender: Cheri Guppy in Treatment: 2 Visit Information History Since Last Visit Added or deleted any medications: No Patient Arrived: Kasandra Knudsen Any new allergies or adverse reactions: No Arrival Time: 14:18 Had a fall or experienced change in No Accompanied By: self activities of daily living that may affect Transfer Assistance: None risk of falls: Patient Identification Verified: Yes Signs or symptoms of abuse/neglect since last visito No Secondary Verification Process Completed: Yes Hospitalized since last visit: No Patient Requires Transmission-Based Precautions: No Implantable device outside of the clinic excluding No Patient Has Alerts: No cellular tissue based products placed in the center since last visit: Has Dressing in Place as Prescribed: Yes Pain Present Now: No Electronic Signature(s) Signed: 06/18/2020 4:57:52 PM By: Deon Pilling Entered By: Deon Pilling on 06/18/2020 14:21:57 -------------------------------------------------------------------------------- Clinic Level of Care Assessment Details Patient Name: Date of Service: CHEVELLE, DURR [name] 06/18/2020 2:15 PM Medical Record Number: [id number] Patient Account Number: [id number] Date of Birth/Sex: Treating RN: 1975/10/01 (45 y.o. Janyth Contes Primary Care Dhamar Gregory: Jonathon Jordan Other Clinician: Referring Tyshan Enderle: Treating Rohit Deloria/Extender: Cheri Guppy in Treatment: 2 Clinic Level of Care Assessment Items TOOL 4 Quantity Score X- 1 0 Use when only an EandM is performed on FOLLOW-UP  visit ASSESSMENTS - Nursing Assessment / Reassessment X- 1 10 Reassessment of Co-morbidities (includes updates in patient status) X- 1 5 Reassessment of Adherence to Treatment Plan ASSESSMENTS - Wound and Skin A ssessment / Reassessment X - Simple Wound Assessment / Reassessment - one wound 1 5 []  - 0 Complex Wound Assessment / Reassessment - multiple wounds []  - 0 Dermatologic / Skin Assessment (not related to wound area) ASSESSMENTS - Focused Assessment []  - 0 Circumferential Edema Measurements - multi extremities []  - 0 Nutritional Assessment / Counseling / Intervention []  - 0 Lower Extremity Assessment (monofilament, tuning fork, pulses) []  - 0 Peripheral Arterial Disease Assessment (using hand held doppler) ASSESSMENTS - Ostomy and/or Continence Assessment and Care []  - 0 Incontinence Assessment and Management []  - 0 Ostomy Care Assessment and Management (repouching, etc.) PROCESS - Coordination of Care X - Simple Patient / Family Education for ongoing care 1 15 []  - 0 Complex (extensive) Patient / Family Education for ongoing care X- 1 10 Staff obtains Programmer, systems, Records, T Results / Process Orders est []  - 0 Staff telephones HHA, Nursing Homes / Clarify orders / etc []  - 0 Routine Transfer to another Facility (non-emergent condition) []  - 0 Routine Hospital Admission (non-emergent condition) []  - 0 New Admissions / Biomedical engineer / Ordering NPWT Apligraf, etc. , []  - 0 Emergency Hospital Admission (emergent condition) X- 1 10 Simple Discharge Coordination []  - 0 Complex (extensive) Discharge Coordination PROCESS - Special Needs []  - 0 Pediatric / Minor Patient Management []  - 0 Isolation Patient Management []  - 0 Hearing / Language / Visual special needs []  - 0 Assessment of Community assistance (transportation, D/C planning, etc.) []  - 0 Additional assistance / Altered mentation []  - 0 Support Surface(s) Assessment (bed, cushion, seat,  etc.) INTERVENTIONS - Wound Cleansing / Measurement X - Simple Wound Cleansing - one wound 1 5 []  - 0 Complex Wound Cleansing - multiple wounds X- 1  5 Wound Imaging (photographs - any number of wounds) $RemoveBe'[]'GhCDDPnWS$  - 0 Wound Tracing (instead of photographs) X- 1 5 Simple Wound Measurement - one wound $RemoveB'[]'ctJYuQFB$  - 0 Complex Wound Measurement - multiple wounds INTERVENTIONS - Wound Dressings X - Small Wound Dressing one or multiple wounds 1 10 $Re'[]'jbH$  - 0 Medium Wound Dressing one or multiple wounds $RemoveBeforeD'[]'IVKmQhxELiDLwp$  - 0 Large Wound Dressing one or multiple wounds $RemoveBeforeD'[]'pXaNboFkQNQGEi$  - 0 Application of Medications - topical $RemoveB'[]'nlxlsBIR$  - 0 Application of Medications - injection INTERVENTIONS - Miscellaneous $RemoveBeforeD'[]'fPgAYWqSvQXRvM$  - 0 External ear exam X- 1 5 Specimen Collection (cultures, biopsies, blood, body fluids, etc.) X- 1 5 Specimen(s) / Culture(s) sent or taken to Lab for analysis $RemoveBefo'[]'HXuuOsWGxrd$  - 0 Patient Transfer (multiple staff / Civil Service fast streamer / Similar devices) $RemoveBeforeDE'[]'mXqrDsoQLTxvlUr$  - 0 Simple Staple / Suture removal (25 or less) $Remove'[]'qgaXzog$  - 0 Complex Staple / Suture removal (26 or more) $Remove'[]'indeAjH$  - 0 Hypo / Hyperglycemic Management (close monitor of Blood Glucose) $RemoveBefore'[]'cbhIpfPjyyJdQ$  - 0 Ankle / Brachial Index (ABI) - do not check if billed separately X- 1 5 Vital Signs Has the patient been seen at the hospital within the last three years: Yes Total Score: 95 Level Of Care: New/Established - Level 3 Electronic Signature(s) Signed: 06/18/2020 5:00:21 PM By: Levan Hurst RN, BSN Entered By: Levan Hurst on 06/18/2020 15:39:44 -------------------------------------------------------------------------------- Encounter Discharge Information Details Patient Name: Date of Service: Coombs, [name] 06/18/2020 2:15 PM Medical Record Number: [id number] Patient Account Number: [id number] Date of Birth/Sex: Treating RN: 1975-09-18 (45 y.o. Hessie Diener Primary Care Lakindra Wible: Jonathon Jordan Other Clinician: Referring Kristin Barcus: Treating Quinto Tippy/Extender: Cheri Guppy in  Treatment: 2 Encounter Discharge Information Items Discharge Condition: Stable Ambulatory Status: Cane Discharge Destination: Home Transportation: Private Auto Accompanied By: self Schedule Follow-up Appointment: Yes Clinical Summary of Care: Electronic Signature(s) Signed: 06/18/2020 4:57:52 PM By: Deon Pilling Entered By: Deon Pilling on 06/18/2020 15:24:33 -------------------------------------------------------------------------------- Lower Extremity Assessment Details Patient Name: Date of Service: AMANUEL, [place]INKFIELD [name] 06/18/2020 2:15 PM Medical Record Number: [id number] Patient Account Number: [id number] Date of Birth/Sex: Treating RN: 1976/01/07 (45 y.o. Hessie Diener Primary Care Maisie Hauser: Jonathon Jordan Other Clinician: Referring Aeriana Speece: Treating Mayanna Garlitz/Extender: Cheri Guppy in Treatment: 2 Electronic Signature(s) Signed: 06/18/2020 4:57:52 PM By: Deon Pilling Entered By: Deon Pilling on 06/18/2020 14:22:47 -------------------------------------------------------------------------------- Multi Wound Chart Details Patient Name: Date of Service: Fredna Dow [name] 06/18/2020 2:15 PM Medical Record Number: [id number] Patient Account Number: [id number] Date of Birth/Sex: Treating RN: 06-Apr-1975 (45 y.o. Janyth Contes Primary Care Naryiah Schley: Jonathon Jordan Other Clinician: Referring Iverna Hammac: Treating Kaleeyah Cuffie/Extender: Cheri Guppy in Treatment: 2 Vital Signs Height(in): 40 Pulse(bpm): 85 Weight(lbs): 256 Blood Pressure(mmHg): 148/96 Body Mass Index(BMI): 43 Temperature(F): 98.2 Respiratory Rate(breaths/min): 20 Photos: [1:No Photos Left Chest] [N/A:N/A N/A] Wound Location: [1:Bump] [N/A:N/A] Wounding Event: [1:Abscess] [N/A:N/A] Primary Etiology: [1:Sleep Apnea, Congestive Heart] [N/A:N/A] Comorbid History: [1:Failure, Hypertension, Type II Diabetes, Osteoarthritis, Neuropathy 03/26/2020] [N/A:N/A] Date  Acquired: [1:2] [N/A:N/A] Weeks of Treatment: [1:Open] [N/A:N/A] Wound Status: [1:0.2x0.5x0.3] [N/A:N/A] Measurements L x W x D (cm) [1:0.079] [N/A:N/A] A (cm) : rea [1:0.024] [N/A:N/A] Volume (cm) : [1:83.20%] [N/A:N/A] % Reduction in A rea: [1:94.90%] [N/A:N/A] % Reduction in Volume: [1:11] Starting Position 1 (o'clock): [1:1] Ending Position 1 (o'clock): [1:2.2] Maximum Distance 1 (cm): [1:Yes] [N/A:N/A] Undermining: [1:Full Thickness Without Exposed] [N/A:N/A] Classification: [1:Support Structures Medium] [N/A:N/A] Exudate Amount: [1:Serosanguineous] [N/A:N/A] Exudate Type: [1:red, brown] [N/A:N/A] Exudate Color: [1:Well defined, not attached] [N/A:N/A] Wound Margin: [1:Large (67-100%)] [N/A:N/A]  Granulation Amount: [1:Red] [N/A:N/A] Granulation Quality: [1:None Present (0%)] [N/A:N/A] Necrotic Amount: [1:Fat Layer (Subcutaneous Tissue): Yes N/A] Exposed Structures: [1:Fascia: No Tendon: No Muscle: No Joint: No Bone: No Small (1-33%)] [N/A:N/A] Treatment Notes Electronic Signature(s) Signed: 06/18/2020 4:59:32 PM By: Linton Ham MD Signed: 06/18/2020 5:00:21 PM By: Levan Hurst RN, BSN Entered By: Linton Ham on 06/18/2020 15:07:49 -------------------------------------------------------------------------------- Multi-Disciplinary Care Plan Details Patient Name: Date of Service: Fredna Dow [name] 06/18/2020 2:15 PM Medical Record Number: [id number] Patient Account Number: [id number] Date of Birth/Sex: Treating RN: 05/31/1975 (45 y.o. Janyth Contes Primary Care Gurtha Picker: Jonathon Jordan Other Clinician: Referring Luddie Boghosian: Treating Clemens Lachman/Extender: Cheri Guppy in Treatment: 2 Multidisciplinary Care Plan reviewed with physician Active Inactive Nutrition Nursing Diagnoses: Impaired glucose control: actual or potential Potential for alteratiion in Nutrition/Potential for imbalanced nutrition Goals: Patient/caregiver agrees to and  verbalizes understanding of need to use nutritional supplements and/or vitamins as prescribed Date Initiated: 06/04/2020 Target Resolution Date: 07/06/2020 Goal Status: Active Patient/caregiver will maintain therapeutic glucose control Date Initiated: 06/04/2020 Target Resolution Date: 07/06/2020 Goal Status: Active Interventions: Assess HgA1c results as ordered upon admission and as needed Assess patient nutrition upon admission and as needed per policy Provide education on elevated blood sugars and impact on wound healing Provide education on nutrition Treatment Activities: Education provided on Nutrition : 06/04/2020 Notes: Wound/Skin Impairment Nursing Diagnoses: Impaired tissue integrity Knowledge deficit related to ulceration/compromised skin integrity Goals: Patient will demonstrate a reduced rate of smoking or cessation of smoking Date Initiated: 06/04/2020 Target Resolution Date: 07/06/2020 Goal Status: Active Patient/caregiver will verbalize understanding of skin care regimen Date Initiated: 06/04/2020 Target Resolution Date: 07/06/2020 Goal Status: Active Ulcer/skin breakdown will have a volume reduction of 30% by week 4 Date Initiated: 06/04/2020 Target Resolution Date: 07/06/2020 Goal Status: Active Interventions: Assess patient/caregiver ability to obtain necessary supplies Assess patient/caregiver ability to perform ulcer/skin care regimen upon admission and as needed Assess ulceration(s) every visit Provide education on smoking Provide education on ulcer and skin care Notes: Electronic Signature(s) Signed: 06/18/2020 5:00:21 PM By: Levan Hurst RN, BSN Entered By: Levan Hurst on 06/18/2020 14:45:32 -------------------------------------------------------------------------------- Pain Assessment Details Patient Name: Date of Service: Fredna Dow [name] 06/18/2020 2:15 PM Medical Record Number: [id number] Patient Account Number: [id number] Date of Birth/Sex: Treating  RN: [birth year] (45 y.o. Hessie Diener Primary Care Briann Sarchet: Jonathon Jordan Other Clinician: Referring Maysel Mccolm: Treating Bich Mchaney/Extender: Cheri Guppy in Treatment: 2 Active Problems Location of Pain Severity and Description of Pain Patient Has Paino No Site Locations Rate the pain. Current Pain Level: 0 Pain Management and Medication Current Pain Management: Medication: No Cold Application: No Rest: No Massage: No Activity: No T.E.N.S.: No Heat Application: No Leg drop or elevation: No Is the Current Pain Management Adequate: Adequate How does your wound impact your activities of daily livingo Sleep: No Bathing: No Appetite: No Relationship With Others: No Bladder Continence: No Emotions: No Bowel Continence: No Work: No Toileting: No Drive: No Dressing: No Hobbies: No Electronic Signature(s) Signed: 06/18/2020 4:57:52 PM By: Deon Pilling Entered By: Deon Pilling on 06/18/2020 14:22:40 -------------------------------------------------------------------------------- Patient/Caregiver Education Details Patient Name: Date of Service: Wyline Copas 4/4/2022andnbsp2:15 PM Medical Record Number: [id number] Patient Account Number: [id number] Date of Birth/Gender: Treating RN: Dec 07, 1975 (44 y.o. Janyth Contes Primary Care Physician: Jonathon Jordan Other Clinician: Referring Physician: Treating Physician/Extender: Cheri Guppy in Treatment: 2 Education Assessment Education Provided To: Patient Education Topics Provided Wound/Skin Impairment: Methods: Explain/Verbal Responses: State content correctly Electronic  Signature(s) Signed: 06/18/2020 5:00:21 PM By: Levan Hurst RN, BSN Signed: 06/18/2020 5:00:21 PM By: Levan Hurst RN, BSN Entered By: Levan Hurst on 06/18/2020 14:45:43 -------------------------------------------------------------------------------- Wound Assessment Details Patient  Name: Date of Service: Donaho, [name] 06/18/2020 2:15 PM Medical Record Number: [id number] Patient Account Number: [id number] Date of Birth/Sex: Treating RN: [birth year] (45 y.o. Lorette Ang, Meta.Reding Primary Care Levis Nazir: Jonathon Jordan Other Clinician: Referring Hartlyn Reigel: Treating Tamber Burtch/Extender: Cheri Guppy in Treatment: 2 Wound Status Wound Number: 1 Primary Abscess Etiology: Wound Location: Left Chest Wound Open Wounding Event: Bump Status: Date Acquired: 03/26/2020 Comorbid Sleep Apnea, Congestive Heart Failure, Hypertension, Type II Weeks Of Treatment: 2 History: Diabetes, Osteoarthritis, Neuropathy Clustered Wound: No Photos Wound Measurements Length: (cm) 0.2 Width: (cm) 0.5 Depth: (cm) 0.3 Area: (cm) 0.079 Volume: (cm) 0.024 % Reduction in Area: 83.2% % Reduction in Volume: 94.9% Epithelialization: Small (1-33%) Tunneling: No Undermining: Yes Starting Position (o'clock): 11 Ending Position (o'clock): 1 Maximum Distance: (cm) 2.2 Wound Description Classification: Full Thickness Without Exposed Support Structures Wound Margin: Well defined, not attached Exudate Amount: Medium Exudate Type: Serosanguineous Exudate Color: red, brown Foul Odor After Cleansing: No Slough/Fibrino No Wound Bed Granulation Amount: Large (67-100%) Exposed Structure Granulation Quality: Red Fascia Exposed: No Necrotic Amount: None Present (0%) Fat Layer (Subcutaneous Tissue) Exposed: Yes Tendon Exposed: No Muscle Exposed: No Joint Exposed: No Bone Exposed: No Treatment Notes Wound #1 (Chest) Wound Laterality: Left Cleanser [place] Ancillary Kit - 15 Day Supply Discharge Instruction: Use supplies as instructed; Kit contains: (15) Saline Bullets; (15) 3x3 Gauze; 15 pr Gloves Peri-Wound Care Skin Prep Discharge Instruction: Use skin prep as directed Topical Primary Dressing KerraCel Ag Gelling Fiber Dressing, 2x2 in (silver alginate) Discharge  Instruction: Apply silver alginate to wound bed as instructed Secondary Dressing Bordered Gauze, 4x4 in Discharge Instruction: Apply over primary dressing as directed. Secured With Compression Wrap Compression Stockings Environmental education officer) Signed: 06/18/2020 4:57:52 PM By: Deon Pilling Signed: 06/19/2020 2:26:07 PM By: Sandre Kitty Entered By: Sandre Kitty on 06/18/2020 16:53:45 -------------------------------------------------------------------------------- Vitals Details Patient Name: Date of Service: Koranda, [name] 06/18/2020 2:15 PM Medical Record Number: [id number] Patient Account Number: [id number] Date of Birth/Sex: Treating RN: 30-Nov-1975 (45 y.o. Lorette Ang, Tammi Klippel Primary Care Latifa Noble: Jonathon Jordan Other Clinician: Referring Julious Langlois: Treating Amarea Macdowell/Extender: Cheri Guppy in Treatment: 2 Vital Signs Time Taken: 14:18 Temperature (F): 98.2 Height (in): 65 Pulse (bpm): 94 Weight (lbs): 256 Respiratory Rate (breaths/min): 20 Body Mass Index (BMI): 42.6 Blood Pressure (mmHg): 148/96 Reference Range: 80 - 120 mg / dl Electronic Signature(s) Signed: 06/18/2020 4:57:52 PM By: Deon Pilling Entered By: Deon Pilling on 06/18/2020 14:22:31

## 2020-06-21 LAB — AEROBIC CULTURE W GRAM STAIN (SUPERFICIAL SPECIMEN)

## 2020-06-25 ENCOUNTER — Other Ambulatory Visit: Payer: Self-pay

## 2020-06-25 ENCOUNTER — Encounter (HOSPITAL_BASED_OUTPATIENT_CLINIC_OR_DEPARTMENT_OTHER): Payer: Medicare Other | Admitting: Internal Medicine

## 2020-06-25 DIAGNOSIS — I509 Heart failure, unspecified: Secondary | ICD-10-CM | POA: Diagnosis not present

## 2020-06-25 DIAGNOSIS — L723 Sebaceous cyst: Secondary | ICD-10-CM | POA: Diagnosis not present

## 2020-06-25 DIAGNOSIS — E785 Hyperlipidemia, unspecified: Secondary | ICD-10-CM | POA: Diagnosis not present

## 2020-06-25 DIAGNOSIS — L98498 Non-pressure chronic ulcer of skin of other sites with other specified severity: Secondary | ICD-10-CM | POA: Diagnosis not present

## 2020-06-25 DIAGNOSIS — L743 Miliaria, unspecified: Secondary | ICD-10-CM

## 2020-06-25 DIAGNOSIS — I110 Hypertensive heart disease with heart failure: Secondary | ICD-10-CM | POA: Diagnosis not present

## 2020-06-25 DIAGNOSIS — Z8249 Family history of ischemic heart disease and other diseases of the circulatory system: Secondary | ICD-10-CM | POA: Diagnosis not present

## 2020-06-25 DIAGNOSIS — E1140 Type 2 diabetes mellitus with diabetic neuropathy, unspecified: Secondary | ICD-10-CM | POA: Diagnosis not present

## 2020-06-25 DIAGNOSIS — F17210 Nicotine dependence, cigarettes, uncomplicated: Secondary | ICD-10-CM | POA: Diagnosis not present

## 2020-06-25 NOTE — Progress Notes (Signed)
Marc Galloway ([id number]) Visit Report for 06/25/2020 Chief Complaint Document Details Patient Name: Date of Service: Marc Galloway, Marc [name] 06/25/2020 2:00 PM Medical Record Number: [id number] Patient Account Number: [id number] Date of Birth/Sex: Treating RN: January 18, 1976 (45 y.o. [name] Primary Care Provider: [name] Other Clinician: Referring Provider: Treating Provider/Extender: Burman Riis in Treatment: 3 Information Obtained from: Patient Chief Complaint 06/04/2020; this is Marc patient with Marc open wound on the left upper anterior chest Electronic Signature(s) Signed: 06/25/2020 4:22:52 PM By: Geralyn Corwin DO Entered By: Geralyn Corwin on 06/25/2020 15:44:17 -------------------------------------------------------------------------------- HPI Details Patient Name: Date of Service: Marc Galloway, Marc [name] 06/25/2020 2:00 PM Medical Record Number: [id number] Patient Account Number: [id number] Date of Birth/Sex: Treating RN: September 14, 1975 (45 y.o. [name] Primary Care Provider: [name] Other Clinician: Referring Provider: Treating Provider/Extender: Burman Riis in Treatment: 3 History of Present Illness HPI Description: ADMISSION 06/04/2020 This is Marc 45 year old man who is Marc type II diabetic. Apparently in January noticed some itching on his anterior upper chest scratched incessantly and then opened the wound. According to his primary doctor's notes from 04/20/2020 this presented as Marc cyst that either popped or was I indeed. He was placed on antibiotics doxycycline. The patient thinks Marc culture that was done that was negative although there was Marc fair amount of drainage. Fortunately the area never really healed. He now has an open area roughly 1 cm in depth and 1.7 cm of undermining at roughly 12:00 Past medical history includes type 2 diabetes, alcohol use, tobacco abuse, left ankle wound, depression, obstructive  sleep apnea, hypertension, hyperlipidemia, left ankle Charcot deformity 3/28; abscess or cyst on the left upper chest. This is come in from an undermining area of 1.7-1 this is improved overall circumference seems better. We have been using silver alginate. No evidence of infection 4/4; abscess or cyst on the left upper chest that went underwent an IandD. I measured the undermining area last time at 1 cm although this is probably Marc bit optimistic as we got 1.7 today. The orifice is certainly smaller although this tunnels superiorly. There is no tenderness we have been using silver alginate 4/11; Patient had Marc culture done at last clinic visit that showed few E. coli and staph. He was started on Augmentin and will continue this for 10 days. He continues to use silver alginate every other day however reports issues with keeping the dressing in place due to hair on his chest and small wound opening.He denies any purulent drainage, increased warmth or tenderness to the area. Electronic Signature(s) Signed: 06/25/2020 4:22:52 PM By: Geralyn Corwin DO Entered By: Geralyn Corwin on 06/25/2020 15:46:49 -------------------------------------------------------------------------------- Physical Exam Details Patient Name: Date of Service: Marc Galloway, Marc [name] 06/25/2020 2:00 PM Medical Record Number: [id number] Patient Account Number: [id number] Date of Birth/Sex: Treating RN: Sep 09, 1975 (45 y.o. [name] Primary Care Provider: [name] Other Clinician: Referring Provider: Treating Provider/Extender: Francina Ames in Treatment: 3 Constitutional respirations regular, non-labored and within target range for patient.. Notes Chest: Small opening with 2.5 cm tunneling superiorly. This does not appear to go to bone. He does not have any tenderness to palpation or erythema to the wound. Drainage is serosanguineous. Electronic Signature(s) Signed: 06/25/2020 4:22:52 PM By:  Geralyn Corwin DO Signed: 06/25/2020 4:45:43 PM By: Baltazar Najjar MD Entered By: Geralyn Corwin on 06/25/2020 15:55:30 -------------------------------------------------------------------------------- Physician Orders Details Patient Name: Date of Service: Marc Galloway, Marc [name] 06/25/2020 2:00 PM  Medical Record Number: [id number] Patient Account Number: [id number] Date of Birth/Sex: Treating RN: September 14, 1975 (45 y.o. [name] Primary Care Provider: [name] Other Clinician: Referring Provider: Treating Provider/Extender: Burman Riis in Treatment: 3 Verbal / Phone Orders: No Diagnosis Coding ICD-10 Coding Code Description L98.498 Non-pressure chronic ulcer of skin of other sites with other specified severity L72.3 Sebaceous cyst Follow-up Appointments ppointment in 1 week. - with Dr. Mikey Bussing Return Marc Bathing/ Shower/ Hygiene May shower and wash wound with soap and water. - on days that dressing is changed Wound Treatment Wound #1 - Chest Wound Laterality: Left Cleanser: Normal Saline Every Other Day/15 Days Discharge Instructions: Cleanse the wound with Normal Saline prior to applying Marc clean dressing using gauze sponges, not tissue or cotton balls. Peri-Wound Care: Skin Prep (Generic) Every Other Day/15 Days Discharge Instructions: Use skin prep as directed Prim Dressing: Iodoform packing strip 1/4 (in) Every Other Day/15 Days ary Discharge Instructions: Lightly pack as instructed Secondary Dressing: Bordered Gauze, 4x4 in (Generic) Every Other Day/15 Days Discharge Instructions: Apply over primary dressing as directed. Electronic Signature(s) Signed: 06/25/2020 4:22:52 PM By: Geralyn Corwin DO Entered By: Geralyn Corwin on 06/25/2020 15:56:19 -------------------------------------------------------------------------------- Problem List Details Patient Name: Date of Service: Marc Galloway, Marc [name] 06/25/2020 2:00 PM Medical Record Number:  [id number] Patient Account Number: [id number] Date of Birth/Sex: Treating RN: 04/20/1975 (45 y.o. [name] Primary Care Provider: [name] Other Clinician: Referring Provider: Treating Provider/Extender: Burman Riis in Treatment: 3 Active Problems ICD-10 Encounter Code Description Active Date MDM Diagnosis L98.498 Non-pressure chronic ulcer of skin of other sites with other specified severity 06/04/2020 No Yes L72.3 Sebaceous cyst 06/04/2020 No Yes Inactive Problems Resolved Problems Electronic Signature(s) Signed: 06/25/2020 4:22:52 PM By: Geralyn Corwin DO Entered By: Geralyn Corwin on 06/25/2020 15:43:51 -------------------------------------------------------------------------------- Progress Note Details Patient Name: Date of Service: Marc Galloway, Marc [name] 06/25/2020 2:00 PM Medical Record Number: [id number] Patient Account Number: [id number] Date of Birth/Sex: Treating RN: 22-Jan-1976 (45 y.o. [name] Primary Care Provider: [name] Other Clinician: Referring Provider: Treating Provider/Extender: Burman Riis in Treatment: 3 Subjective Chief Complaint Information obtained from Patient 06/04/2020; this is Marc patient with Marc open wound on the left upper anterior chest History of Present Illness (HPI) ADMISSION 06/04/2020 This is Marc 45 year old man who is Marc type II diabetic. Apparently in January noticed some itching on his anterior upper chest scratched incessantly and then opened the wound. According to his primary doctor's notes from 04/20/2020 this presented as Marc cyst that either popped or was I indeed. He was placed on antibiotics doxycycline. The patient thinks Marc culture that was done that was negative although there was Marc fair amount of drainage. Fortunately the area never really healed. He now has an open area roughly 1 cm in depth and 1.7 cm of undermining at roughly 12:00 Past medical history  includes type 2 diabetes, alcohol use, tobacco abuse, left ankle wound, depression, obstructive sleep apnea, hypertension, hyperlipidemia, left ankle Charcot deformity 3/28; abscess or cyst on the left upper chest. This is come in from an undermining area of 1.7-1 this is improved overall circumference seems better. We have been using silver alginate. No evidence of infection 4/4; abscess or cyst on the left upper chest that went underwent an IandD. I measured the undermining area last time at 1 cm although this is probably Marc bit optimistic as we got 1.7 today. The orifice is certainly smaller although this tunnels superiorly.  There is no tenderness we have been using silver alginate 4/11; Patient had Marc culture done at last clinic visit that showed few E. coli and staph. He was started on Augmentin and will continue this for 10 days. He continues to use silver alginate every other day however reports issues with keeping the dressing in place due to hair on his chest and small wound opening.He denies any purulent drainage, increased warmth or tenderness to the area. Patient History Information obtained from Patient. Family History Cancer - Mother, Heart Disease - Mother, Hypertension - Mother, No family history of Diabetes, Hereditary Spherocytosis, Kidney Disease, Lung Disease, Seizures, Stroke, Thyroid Problems, Tuberculosis. Social History Current every day smoker - 1/2 ppd, Marital Status - Single, Alcohol Use - Daily - beer, Drug Use - No History, Caffeine Use - Daily - coffee, tea, soda. Medical History Eyes Denies history of Cataracts, Glaucoma, Optic Neuritis Ear/Nose/Mouth/Throat Denies history of Chronic sinus problems/congestion, Middle ear problems Respiratory Patient has history of Sleep Apnea Cardiovascular Patient has history of Congestive Heart Failure, Hypertension Endocrine Patient has history of Type II Diabetes Denies history of Type I Diabetes Genitourinary Denies  history of End Stage Renal Disease Integumentary (Skin) Denies history of History of Burn Musculoskeletal Patient has history of Osteoarthritis Denies history of Gout, Rheumatoid Arthritis, Osteomyelitis Neurologic Patient has history of Neuropathy Denies history of Dementia, Quadriplegia, Paraplegia, Seizure Disorder Oncologic Denies history of Received Chemotherapy, Received Radiation Psychiatric Denies history of Anorexia/bulimia, Confinement Anxiety Hospitalization/Surgery History - tonsillectomy. - tubes in ears. - nasal reconstruction. Medical Marc Surgical History Notes nd Constitutional Symptoms (General Health) morbid obesity Cardiovascular hyperlipidemia Musculoskeletal charcot left ankle Psychiatric depression Objective Constitutional respirations regular, non-labored and within target range for patient.. Vitals Time Taken: 1:48 PM, Height: 65 in, Weight: 256 lbs, BMI: 42.6, Temperature: 98.6 F, Pulse: 96 bpm, Respiratory Rate: 18 breaths/min, Blood Pressure: 168/113 mmHg, Capillary Blood Glucose: 187 mg/dl. General Notes: Pt states he forgot to take BP med today. General Notes: Chest: Small opening with 2.5 cm tunneling superiorly. This does not appear to go to bone. He does not have any tenderness to palpation or erythema to the wound. Drainage is serosanguineous. Integumentary (Hair, Skin) Wound #1 status is Open. Original cause of wound was Bump. The date acquired was: 03/26/2020. The wound has been in treatment 3 weeks. The wound is located on the Left Chest. The wound measures 0.2cm length x 0.3cm width x 0.4cm depth; 0.047cm^2 area and 0.019cm^3 volume. There is Fat Layer (Subcutaneous Tissue) exposed. There is tunneling at 12:00 with Marc maximum distance of 2.5cm. There is Marc medium amount of serosanguineous drainage noted. The wound margin is well defined and not attached to the wound base. There is large (67-100%) red granulation within the wound bed. There is no  necrotic tissue within the wound bed. Assessment Active Problems ICD-10 Non-pressure chronic ulcer of skin of other sites with other specified severity Sebaceous cyst The superficial opening to the wound is closing and appears smaller than previous visits. However tunneling is increased and I am concerned that the skin will close over the area with Marc large depth that has not filled in. I would like to try packing the wound to see if this is will help fill it in from bottom to top. No signs of infection today. Patient currently taking Augmentin and doing well. Plan Follow-up Appointments: Return Appointment in 1 week. - with Dr. Mikey BussingHoffman Bathing/ Shower/ Hygiene: May shower and wash wound with soap and water. - on  days that dressing is changed WOUND #1: - Chest Wound Laterality: Left Cleanser: Normal Saline Every Other Day/15 Days Discharge Instructions: Cleanse the wound with Normal Saline prior to applying Marc clean dressing using gauze sponges, not tissue or cotton balls. Peri-Wound Care: Skin Prep (Generic) Every Other Day/15 Days Discharge Instructions: Use skin prep as directed Prim Dressing: Iodoform packing strip 1/4 (in) Every Other Day/15 Days ary Discharge Instructions: Lightly pack as instructed Secondary Dressing: Bordered Gauze, 4x4 in (Generic) Every Other Day/15 Days Discharge Instructions: Apply over primary dressing as directed. 1. Iodoform packing every other day. Will hold off using silver alginate at this time. 2. Continue Augmentin 3. Follow-up in 1 week Electronic Signature(s) Signed: 06/25/2020 4:22:52 PM By: Geralyn Corwin DO Entered By: Geralyn Corwin on 06/25/2020 15:57:11 -------------------------------------------------------------------------------- HxROS Details Patient Name: Date of Service: Marc Galloway, Marc [name] 06/25/2020 2:00 PM Medical Record Number: [id number] Patient Account Number: [id number] Date of Birth/Sex: Treating RN: Jul 26, 1975 (45 y.o. [name] Primary Care Provider: [name] Other Clinician: Referring Provider: Treating Provider/Extender: Francina Ames in Treatment: 3 Information Obtained From Patient Constitutional Symptoms (General Health) Medical History: Past Medical History Notes: morbid obesity Eyes Medical History: Negative for: Cataracts; Glaucoma; Optic Neuritis Ear/Nose/Mouth/Throat Medical History: Negative for: Chronic sinus problems/congestion; Middle ear problems Respiratory Medical History: Positive for: Sleep Apnea Cardiovascular Medical History: Positive for: Congestive Heart Failure; Hypertension Past Medical History Notes: hyperlipidemia Endocrine Medical History: Positive for: Type II Diabetes Negative for: Type I Diabetes Time with diabetes: since 2016 Treated with: Insulin, Oral agents Blood sugar tested every day: No Genitourinary Medical History: Negative for: End Stage Renal Disease Integumentary (Skin) Medical History: Negative for: History of Burn Musculoskeletal Medical History: Positive for: Osteoarthritis Negative for: Gout; Rheumatoid Arthritis; Osteomyelitis Past Medical History Notes: charcot left ankle Neurologic Medical History: Positive for: Neuropathy Negative for: Dementia; Quadriplegia; Paraplegia; Seizure Disorder Oncologic Medical History: Negative for: Received Chemotherapy; Received Radiation Psychiatric Medical History: Negative for: Anorexia/bulimia; Confinement Anxiety Past Medical History Notes: depression Immunizations Pneumococcal Vaccine: Received Pneumococcal Vaccination: No Implantable Devices None Hospitalization / Surgery History Type of Hospitalization/Surgery tonsillectomy tubes in ears nasal reconstruction Family and Social History Cancer: Yes - Mother; Diabetes: No; Heart Disease: Yes - Mother; Hereditary Spherocytosis: No; Hypertension: Yes - Mother; Kidney Disease: No; Lung Disease:  No; Seizures: No; Stroke: No; Thyroid Problems: No; Tuberculosis: No; Current every day smoker - 1/2 ppd; Marital Status - Single; Alcohol Use: Daily - beer; Drug Use: No History; Caffeine Use: Daily - coffee, tea, soda; Financial Concerns: No; Food, Clothing or Shelter Needs: No; Support System Lacking: No; Transportation Concerns: No Electronic Signature(s) Signed: 06/25/2020 4:22:52 PM By: Geralyn Corwin DO Signed: 06/25/2020 4:45:43 PM By: Baltazar Najjar MD Signed: 06/25/2020 5:30:45 PM By: Zandra Abts RN, BSN Entered By: Geralyn Corwin on 06/25/2020 15:47:07 -------------------------------------------------------------------------------- SuperBill Details Patient Name: Date of Service: Marc Galloway, Marc [name] 06/25/2020 Medical Record Number: [id number] Patient Account Number: [id number] Date of Birth/Sex: Treating RN: Sep 11, 1975 (45 y.o. [name] Primary Care Provider: [name] Other Clinician: Referring Provider: Treating Provider/Extender: Burman Riis in Treatment: 3 Diagnosis Coding ICD-10 Codes Code Description [phone] Non-pressure chronic ulcer of skin of other sites with other specified severity L72.3 Sebaceous cyst Facility Procedures CPT4 Code: [id number] Description: 99213 - WOUND CARE VISIT-LEV 3 EST PT Modifier: Quantity: 1 Electronic Signature(s) Signed: 06/25/2020 4:22:52 PM By: Geralyn Corwin DO Entered By: Geralyn Corwin on 06/25/2020 15:57:18

## 2020-06-27 NOTE — Progress Notes (Signed)
AUTUMN, GUNN ([id number]) Visit Report for 06/25/2020 Arrival Information Details Patient Name: Date of Service: Marc Galloway, Marc Galloway Marc E. 06/25/2020 2:00 PM Medical Record Number: [id number] Patient Account Number: [id number] Date of Birth/Sex: Treating RN: Mar 09, 1976 (45 y.o. Lytle Michaels Primary Care Deavion Strider: [name] Other Clinician: Referring Brentt Fread: Treating Dillian Feig/Extender: Francina Ames in Treatment: 3 Visit Information History Since Last Visit Added or deleted any medications: No Patient Arrived: Gilmer Mor Any new allergies or adverse reactions: No Arrival Time: 13:43 Had Marc fall or experienced change in No Transfer Assistance: None activities of daily living that may affect Patient Identification Verified: Yes risk of falls: Secondary Verification Process Completed: Yes Signs or symptoms of abuse/neglect since last visito No Patient Requires Transmission-Based Precautions: No Hospitalized since last visit: No Patient Has Alerts: No Implantable device outside of the clinic excluding No cellular tissue based products placed in the center since last visit: Has Dressing in Place as Prescribed: Yes Pain Present Now: No Electronic Signature(s) Signed: 06/25/2020 5:21:09 PM By: Antonieta Iba Entered By: Antonieta Iba on 06/25/2020 13:48:15 -------------------------------------------------------------------------------- Clinic Level of Care Assessment Details Patient Name: Date of Service: Marc Galloway, Marc Marc E. 06/25/2020 2:00 PM Medical Record Number: [id number] Patient Account Number: [id number] Date of Birth/Sex: Treating RN: Aug 07, 1975 (45 y.o. [name] Primary Care Niasia Lanphear: [name] Other Clinician: Referring Knolan Simien: Treating Kieren Ricci/Extender: Francina Ames in Treatment: 3 Clinic Level of Care Assessment Items TOOL 4 Quantity Score X- 1 0 Use when only an EandM is performed on FOLLOW-UP  visit ASSESSMENTS - Nursing Assessment / Reassessment X- 1 10 Reassessment of Co-morbidities (includes updates in patient status) X- 1 5 Reassessment of Adherence to Treatment Plan ASSESSMENTS - Wound and Skin Marc ssessment / Reassessment X - Simple Wound Assessment / Reassessment - one wound 1 5 []  - 0 Complex Wound Assessment / Reassessment - multiple wounds []  - 0 Dermatologic / Skin Assessment (not related to wound area) ASSESSMENTS - Focused Assessment []  - 0 Circumferential Edema Measurements - multi extremities []  - 0 Nutritional Assessment / Counseling / Intervention []  - 0 Lower Extremity Assessment (monofilament, tuning fork, pulses) []  - 0 Peripheral Arterial Disease Assessment (using hand held doppler) ASSESSMENTS - Ostomy and/or Continence Assessment and Care []  - 0 Incontinence Assessment and Management []  - 0 Ostomy Care Assessment and Management (repouching, etc.) PROCESS - Coordination of Care X - Simple Patient / Family Education for ongoing care 1 15 []  - 0 Complex (extensive) Patient / Family Education for ongoing care X- 1 10 Staff obtains , Records, T Results / Process Orders est []  - 0 Staff telephones HHA, Nursing Homes / Clarify orders / etc []  - 0 Routine Transfer to another Facility (non-emergent condition) []  - 0 Routine Hospital Admission (non-emergent condition) []  - 0 New Admissions / / Ordering NPWT Apligraf, etc. , []  - 0 Emergency Hospital Admission (emergent condition) X- 1 10 Simple Discharge Coordination []  - 0 Complex (extensive) Discharge Coordination PROCESS - Special Needs []  - 0 Pediatric / Minor Patient Management []  - 0 Isolation Patient Management []  - 0 Hearing / Language / Visual special needs []  - 0 Assessment of Community assistance (transportation, D/C planning, etc.) []  - 0 Additional assistance / Altered mentation []  - 0 Support Surface(s) Assessment (bed, cushion, seat,  etc.) INTERVENTIONS - Wound Cleansing / Measurement X - Simple Wound Cleansing - one wound 1 5 []  - 0 Complex Wound Cleansing - multiple wounds X- 1 5 Wound Imaging (  photographs - any number of wounds) []  - 0 Wound Tracing (instead of photographs) X- 1 5 Simple Wound Measurement - one wound []  - 0 Complex Wound Measurement - multiple wounds INTERVENTIONS - Wound Dressings X - Small Wound Dressing one or multiple wounds 1 10 []  - 0 Medium Wound Dressing one or multiple wounds []  - 0 Large Wound Dressing one or multiple wounds []  - 0 Application of Medications - topical []  - 0 Application of Medications - injection INTERVENTIONS - Miscellaneous []  - 0 External ear exam []  - 0 Specimen Collection (cultures, biopsies, blood, body fluids, etc.) []  - 0 Specimen(s) / Culture(s) sent or taken to Lab for analysis []  - 0 Patient Transfer (multiple staff / Nurse, adultHoyer Lift / Similar devices) []  - 0 Simple Staple / Suture removal (25 or less) []  - 0 Complex Staple / Suture removal (26 or more) []  - 0 Hypo / Hyperglycemic Management (close monitor of Blood Glucose) []  - 0 Ankle / Brachial Index (ABI) - do not check if billed separately X- 1 5 Vital Signs Has the patient been seen at the hospital within the last three years: Yes Total Score: 85 Level Of Care: New/Established - Level 3 Electronic Signature(s) Signed: 06/25/2020 5:30:45 PM By: Zandra AbtsLynch, Shatara RN, BSN Entered By: Zandra AbtsLynch, Shatara on 06/25/2020 14:11:47 -------------------------------------------------------------------------------- Encounter Discharge Information Details Patient Name: Date of Service: Marc Galloway, Marc Marc E. 06/25/2020 2:00 PM Medical Record Number: [id number] Patient Account Number: [id number] Date of Birth/Sex: Treating RN: 15-Jun-1975 (45 y.o. Lytle MichaelsM) Barnhart, Jodi Primary Care Nami Strawder: Mila PalmerWolters, Sharon Other Clinician: Referring Jazier Mcglamery: Treating Seve Monette/Extender: Francina Amesobson, Michael Wolters, Sharon Weeks in  Treatment: 3 Encounter Discharge Information Items Discharge Condition: Stable Ambulatory Status: Cane Discharge Destination: Home Transportation: Private Auto Schedule Follow-up Appointment: Yes Clinical Summary of Care: Provided on 06/25/2020 Form Type Recipient Paper Patient Patient Electronic Signature(s) Signed: 06/25/2020 5:21:09 PM By: Antonieta IbaBarnhart, Jodi Entered By: Antonieta IbaBarnhart, Jodi on 06/25/2020 14:17:36 -------------------------------------------------------------------------------- Lower Extremity Assessment Details Patient Name: Date of Service: Marc Galloway, Marc Marc E. 06/25/2020 2:00 PM Medical Record Number: [id number] Patient Account Number: [id number] Date of Birth/Sex: Treating RN: 15-Jun-1975 (45 y.o. Lytle MichaelsM) Barnhart, Jodi Primary Care Avonda Toso: Mila PalmerWolters, Sharon Other Clinician: Referring Elsi Stelzer: Treating Thaniel Coluccio/Extender: Francina Amesobson, Michael Wolters, Sharon Weeks in Treatment: 3 Electronic Signature(s) Signed: 06/25/2020 5:21:09 PM By: Antonieta IbaBarnhart, Jodi Entered By: Antonieta IbaBarnhart, Jodi on 06/25/2020 13:49:29 -------------------------------------------------------------------------------- Multi Wound Chart Details Patient Name: Date of Service: Marc Galloway, Marc Marc E. 06/25/2020 2:00 PM Medical Record Number: [id number] Patient Account Number: [id number] Date of Birth/Sex: Treating RN: 15-Jun-1975 (45 y.o. Elizebeth KollerM) Lynch, Shatara Primary Care Brenen Beigel: Mila PalmerWolters, Sharon Other Clinician: Referring Lynette Noah: Treating Chanya Chrisley/Extender: Burman RiisHoffman, Jessica Wolters, Sharon Weeks in Treatment: 3 Vital Signs Height(in): 65 Capillary Blood Glucose(mg/dl): 086187 Weight(lbs): 578256 Pulse(bpm): 96 Body Mass Index(BMI): 43 Blood Pressure(mmHg): 168/113 Temperature(F): 98.6 Respiratory Rate(breaths/min): 18 Photos: [1:No Photos Left Chest] [N/Marc:N/Marc N/Marc] Wound Location: [1:Bump] [N/Marc:N/Marc] Wounding Event: [1:Abscess] [N/Marc:N/Marc] Primary Etiology: [1:Sleep Apnea, Congestive Heart] [N/Marc:N/Marc] Comorbid History: [1:Failure,  Hypertension, Type II Diabetes, Osteoarthritis, Neuropathy 03/26/2020] [N/Marc:N/Marc] Date Acquired: [1:3] [N/Marc:N/Marc] Weeks of Treatment: [1:Open] [N/Marc:N/Marc] Wound Status: [1:0.2x0.3x0.4] [N/Marc:N/Marc] Measurements L x W x D (cm) [1:0.047] [N/Marc:N/Marc] Marc (cm) : rea [1:0.019] [N/Marc:N/Marc] Volume (cm) : [1:90.00%] [N/Marc:N/Marc] % Reduction in Marc rea: [1:96.00%] [N/Marc:N/Marc] % Reduction in Volume: [1:12] Position 1 (o'clock): [1:2.5] Maximum Distance 1 (cm): [1:Yes] [N/Marc:N/Marc] Tunneling: [1:Full Thickness Without Exposed] [N/Marc:N/Marc] Classification: [1:Support Structures Medium] [N/Marc:N/Marc] Exudate Amount: [1:Serosanguineous] [N/Marc:N/Marc] Exudate Type: [1:red, brown] [N/Marc:N/Marc] Exudate Color: [1:Well defined, not attached] [N/Marc:N/Marc] Wound Margin: [1:Large (67-100%)] [  N/Marc:N/Marc] Granulation Amount: [1:Red] [N/Marc:N/Marc] Granulation Quality: [1:None Present (0%)] [N/Marc:N/Marc] Necrotic Amount: [1:Fat Layer (Subcutaneous Tissue): Yes N/Marc] Exposed Structures: [1:Fascia: No Tendon: No Muscle: No Joint: No Bone: No Small (1-33%)] [N/Marc:N/Marc] Treatment Notes Wound #1 (Chest) Wound Laterality: Left Cleanser Normal Saline Discharge Instruction: Cleanse the wound with Normal Saline prior to applying Marc clean dressing using gauze sponges, not tissue or cotton balls. Peri-Wound Care Skin Prep Discharge Instruction: Use skin prep as directed Topical Primary Dressing Iodoform packing strip 1/4 (in) Discharge Instruction: Lightly pack as instructed Secondary Dressing Bordered Gauze, 4x4 in Discharge Instruction: Apply over primary dressing as directed. Secured With Compression Wrap Compression Stockings Facilities manager) Signed: 06/25/2020 4:22:52 PM By: Geralyn Corwin DO Signed: 06/25/2020 5:30:45 PM By: Zandra Abts RN, BSN Entered By: Geralyn Corwin on 06/25/2020 15:44:03 -------------------------------------------------------------------------------- Multi-Disciplinary Care Plan Details Patient Name: Date of  Service: Marc Galloway, Marc Marc E. 06/25/2020 2:00 PM Medical Record Number: [id number] Patient Account Number: [id number] Date of Birth/Sex: Treating RN: 02/11/1976 (46 y.o. [name] Primary Care Naw Lasala: [name] Other Clinician: Referring Calyssa Zobrist: Treating Ekin Pilar/Extender: Francina Ames in Treatment: 3 Multidisciplinary Care Plan reviewed with physician Active Inactive Nutrition Nursing Diagnoses: Impaired glucose control: actual or potential Potential for alteratiion in Nutrition/Potential for imbalanced nutrition Goals: Patient/caregiver agrees to and verbalizes understanding of need to use nutritional supplements and/or vitamins as prescribed Date Initiated: 06/04/2020 Target Resolution Date: 07/06/2020 Goal Status: Active Patient/caregiver will maintain therapeutic glucose control Date Initiated: 06/04/2020 Target Resolution Date: 07/06/2020 Goal Status: Active Interventions: Assess HgA1c results as ordered upon admission and as needed Assess patient nutrition upon admission and as needed per policy Provide education on elevated blood sugars and impact on wound healing Provide education on nutrition Treatment Activities: Education provided on Nutrition : 06/04/2020 Notes: Wound/Skin Impairment Nursing Diagnoses: Impaired tissue integrity Knowledge deficit related to ulceration/compromised skin integrity Goals: Patient will demonstrate Marc reduced rate of smoking or cessation of smoking Date Initiated: 06/04/2020 Target Resolution Date: 07/06/2020 Goal Status: Active Patient/caregiver will verbalize understanding of skin care regimen Date Initiated: 06/04/2020 Target Resolution Date: 07/06/2020 Goal Status: Active Ulcer/skin breakdown will have Marc volume reduction of 30% by week 4 Date Initiated: 06/04/2020 Target Resolution Date: 07/06/2020 Goal Status: Active Interventions: Assess patient/caregiver ability to obtain necessary  supplies Assess patient/caregiver ability to perform ulcer/skin care regimen upon admission and as needed Assess ulceration(s) every visit Provide education on smoking Provide education on ulcer and skin care Notes: Electronic Signature(s) Signed: 06/25/2020 5:30:45 PM By: Zandra Abts RN, BSN Entered By: Zandra Abts on 06/25/2020 14:02:45 -------------------------------------------------------------------------------- Pain Assessment Details Patient Name: Date of Service: Marc Galloway, Marc Marc E. 06/25/2020 2:00 PM Medical Record Number: [id number] Patient Account Number: [id number] Date of Birth/Sex: Treating RN: 1975/12/18 (45 y.o. Lytle Michaels Primary Care Jaman Aro: [name] Other Clinician: Referring Mitchael Luckey: Treating Campbell Agramonte/Extender: Francina Ames in Treatment: 3 Active Problems Location of Pain Severity and Description of Pain Patient Has Paino No Site Locations Pain Management and Medication Current Pain Management: Electronic Signature(s) Signed: 06/25/2020 5:21:09 PM By: Antonieta Iba Entered By: Antonieta Iba on 06/25/2020 13:49:08 -------------------------------------------------------------------------------- Patient/Caregiver Education Details Patient Name: Date of Service: Marc Galloway Marc E. 4/11/2022andnbsp2:00 PM Medical Record Number: [id number] Patient Account Number: [id number] Date of Birth/Gender: Treating RN: [birth year] (45 y.o. [name] Primary Care Physician: [name] Other Clinician: Referring Physician: Treating Physician/Extender: Francina Ames in Treatment: 3 Education Assessment Education Provided To: Patient Education Topics Provided Wound/Skin Impairment: Methods: Explain/Verbal  Responses: State content correctly Electronic Signature(s) Signed: 06/25/2020 5:30:45 PM By: Zandra Abts RN, BSN Entered By: Zandra Abts on 06/25/2020  14:09:23 -------------------------------------------------------------------------------- Wound Assessment Details Patient Name: Date of Service: Marc Galloway, Marc Marc E. 06/25/2020 2:00 PM Medical Record Number: [id number] Patient Account Number: [id number] Date of Birth/Sex: Treating RN: [birth year] (45 y.o. [name] Primary Care Blaze Sandin: [name] Other Clinician: Referring Abelina Ketron: Treating Canio Winokur/Extender: Francina Ames in Treatment: 3 Wound Status Wound Number: 1 Primary Abscess Etiology: Wound Location: Left Chest Wound Open Wounding Event: Bump Status: Date Acquired: 03/26/2020 Comorbid Sleep Apnea, Congestive Heart Failure, Hypertension, Type II Weeks Of Treatment: 3 History: Diabetes, Osteoarthritis, Neuropathy Clustered Wound: No Photos Wound Measurements Length: (cm) 0.2 Width: (cm) 0.3 Depth: (cm) 0.4 Area: (cm) 0.047 Volume: (cm) 0.019 % Reduction in Area: 90% % Reduction in Volume: 96% Epithelialization: Small (1-33%) Tunneling: Yes Position (o'clock): 12 Maximum Distance: (cm) 2.5 Wound Description Classification: Full Thickness Without Exposed Support Structures Wound Margin: Well defined, not attached Exudate Amount: Medium Exudate Type: Serosanguineous Exudate Color: red, brown Foul Odor After Cleansing: No Slough/Fibrino No Wound Bed Granulation Amount: Large (67-100%) Exposed Structure Granulation Quality: Red Fascia Exposed: No Necrotic Amount: None Present (0%) Fat Layer (Subcutaneous Tissue) Exposed: Yes Tendon Exposed: No Muscle Exposed: No Joint Exposed: No Bone Exposed: No Treatment Notes Wound #1 (Chest) Wound Laterality: Left Cleanser Normal Saline Discharge Instruction: Cleanse the wound with Normal Saline prior to applying Marc clean dressing using gauze sponges, not tissue or cotton balls. Peri-Wound Care Skin Prep Discharge Instruction: Use skin prep as directed Topical Primary  Dressing Iodoform packing strip 1/4 (in) Discharge Instruction: Lightly pack as instructed Secondary Dressing Bordered Gauze, 4x4 in Discharge Instruction: Apply over primary dressing as directed. Secured With Compression Wrap Compression Stockings Facilities manager) Signed: 06/25/2020 5:30:45 PM By: Zandra Abts RN, BSN Signed: 06/27/2020 8:29:36 AM By: Karl Ito Entered By: Karl Ito on 06/25/2020 16:26:19 -------------------------------------------------------------------------------- Vitals Details Patient Name: Date of Service: Marc Galloway, Marc Marc E. 06/25/2020 2:00 PM Medical Record Number: [id number] Patient Account Number: [id number] Date of Birth/Sex: Treating RN: 1975/12/05 (45 y.o. Lytle Michaels Primary Care Johnaton Sonneborn: [name] Other Clinician: Referring Darlean Warmoth: Treating Tressie Ragin/Extender: Francina Ames in Treatment: 3 Vital Signs Time Taken: 13:48 Temperature (F): 98.6 Height (in): 65 Pulse (bpm): 96 Weight (lbs): 256 Respiratory Rate (breaths/min): 18 Body Mass Index (BMI): 42.6 Blood Pressure (mmHg): 168/113 Capillary Blood Glucose (mg/dl): 250 Reference Range: 80 - 120 mg / dl Notes Pt states he forgot to take BP med today. Electronic Signature(s) Signed: 06/25/2020 5:21:09 PM By: Antonieta Iba Entered By: Antonieta Iba on 06/25/2020 13:49:01

## 2020-07-03 ENCOUNTER — Other Ambulatory Visit: Payer: Self-pay

## 2020-07-03 ENCOUNTER — Encounter (HOSPITAL_BASED_OUTPATIENT_CLINIC_OR_DEPARTMENT_OTHER): Payer: Medicare Other | Admitting: Internal Medicine

## 2020-07-03 DIAGNOSIS — L98498 Non-pressure chronic ulcer of skin of other sites with other specified severity: Secondary | ICD-10-CM

## 2020-07-03 DIAGNOSIS — I110 Hypertensive heart disease with heart failure: Secondary | ICD-10-CM | POA: Diagnosis not present

## 2020-07-03 DIAGNOSIS — F17210 Nicotine dependence, cigarettes, uncomplicated: Secondary | ICD-10-CM | POA: Diagnosis not present

## 2020-07-03 DIAGNOSIS — I509 Heart failure, unspecified: Secondary | ICD-10-CM | POA: Diagnosis not present

## 2020-07-03 DIAGNOSIS — E785 Hyperlipidemia, unspecified: Secondary | ICD-10-CM | POA: Diagnosis not present

## 2020-07-03 DIAGNOSIS — E1140 Type 2 diabetes mellitus with diabetic neuropathy, unspecified: Secondary | ICD-10-CM | POA: Diagnosis not present

## 2020-07-03 DIAGNOSIS — L723 Sebaceous cyst: Secondary | ICD-10-CM | POA: Diagnosis not present

## 2020-07-03 DIAGNOSIS — Z8249 Family history of ischemic heart disease and other diseases of the circulatory system: Secondary | ICD-10-CM | POA: Diagnosis not present

## 2020-07-05 NOTE — Progress Notes (Signed)
EDEL, RIVERO ([id number]) Visit Report for 07/03/2020 Chief Complaint Document Details Patient Name: Date of Service: NASZIR, COTT [name] 07/03/2020 2:00 PM Medical Record Number: [id number] Patient Account Number: [id number] Date of Birth/Sex: Treating RN: [birth year] (45 y.o. [name] Primary Care Provider: [name] Other Clinician: Referring Provider: Treating Provider/Extender: Burman Riis in Treatment: 4 Information Obtained from: Patient Chief Complaint 06/04/2020; this is a patient with a open wound on the left upper anterior chest Electronic Signature(s) Signed: 07/05/2020 8:51:25 AM By: Geralyn Corwin DO Entered By: Geralyn Corwin on 07/05/2020 08:29:53 -------------------------------------------------------------------------------- HPI Details Patient Name: Date of Service: Cheeks, [name] 07/03/2020 2:00 PM Medical Record Number: [id number] Patient Account Number: [id number] Date of Birth/Sex: Treating RN: 09-22-1975 (45 y.o. [name] Primary Care Provider: [name] Other Clinician: Referring Provider: Treating Provider/Extender: Burman Riis in Treatment: 4 History of Present Illness HPI Description: ADMISSION 06/04/2020 This is a 45 year old man who is a type II diabetic. Apparently in January noticed some itching on his anterior upper chest scratched incessantly and then opened the wound. According to his primary doctor's notes from 04/20/2020 this presented as a cyst that either popped or was I indeed. He was placed on antibiotics doxycycline. The patient thinks a culture that was done that was negative although there was a fair amount of drainage. Fortunately the area never really healed. He now has an open area roughly 1 cm in depth and 1.7 cm of undermining at roughly 12:00 Past medical history includes type 2 diabetes, alcohol use, tobacco abuse, left ankle wound, depression, obstructive  sleep apnea, hypertension, hyperlipidemia, left ankle Charcot deformity 3/28; abscess or cyst on the left upper chest. This is come in from an undermining area of 1.7-1 this is improved overall circumference seems better. We have been using silver alginate. No evidence of infection 4/4; abscess or cyst on the left upper chest that went underwent an IandD. I measured the undermining area last time at 1 cm although this is probably a bit optimistic as we got 1.7 today. The orifice is certainly smaller although this tunnels superiorly. There is no tenderness we have been using silver alginate 4/11; Patient had a culture done at last clinic visit that showed few E. coli and staph. He was started on Augmentin and will continue this for 10 days. He continues to use silver alginate every other day however reports issues with keeping the dressing in place due to hair on his chest and small wound opening.He denies any purulent drainage, increased warmth or tenderness to the area. 4/19; patient presents for 1 week follow-up. He has been using iodoform packing to the wound with no issues. He overall feels well. He denies any purulent drainage increased warmth or tenderness to the area. Patient states he feels nauseous on his current antibiotic regiment and has taken about 4 days worth. He does not want to take it anymore. Electronic Signature(s) Signed: 07/05/2020 8:51:25 AM By: Geralyn Corwin DO Entered By: Geralyn Corwin on 07/05/2020 08:49:07 -------------------------------------------------------------------------------- Physical Exam Details Patient Name: Date of Service: Willis, [name] 07/03/2020 2:00 PM Medical Record Number: [id number] Patient Account Number: [id number] Date of Birth/Sex: Treating RN: [birth year] (45 y.o. [name] Primary Care Provider: [name] Other Clinician: Referring Provider: Treating Provider/Extender: Sallee Provencal Weeks in Treatment:  4 Constitutional respirations regular, non-labored and within target range for patient.. Notes Chest: Small opening with 2 cm tunneling superiorly. He denies any tenderness to  palpation. There is no erythema or increased warmth to the wound site. Drainage is serosanguineous. Electronic Signature(s) Signed: 07/05/2020 8:51:25 AM By: Geralyn Corwin DO Entered By: Geralyn Corwin on 07/05/2020 08:31:43 -------------------------------------------------------------------------------- Physician Orders Details Patient Name: Date of Service: Spires, [name] 07/03/2020 2:00 PM Medical Record Number: [id number] Patient Account Number: [id number] Date of Birth/Sex: Treating RN: 1975/04/20 (45 y.o. [name], Lauren Primary Care Provider: [name] Other Clinician: Referring Provider: Treating Provider/Extender: Burman Riis in Treatment: 4 Verbal / Phone Orders: No Diagnosis Coding ICD-10 Coding Code Description [id number] Non-pressure chronic ulcer of skin of other sites with other specified severity L72.3 Sebaceous cyst Follow-up Appointments ppointment in 2 weeks. - Dr. Mikey Bussing Return A Bathing/ Shower/ Hygiene May shower and wash wound with soap and water. - on days that dressing is changed Wound Treatment Wound #1 - Chest Wound Laterality: Left Cleanser: Normal Saline (DME) (Generic) Every Other Day/15 Days Discharge Instructions: Cleanse the wound with Normal Saline prior to applying a clean dressing using gauze sponges, not tissue or cotton balls. Peri-Wound Care: Skin Prep (DME) (Generic) Every Other Day/15 Days Discharge Instructions: Use skin prep as directed Prim Dressing: Iodoform packing strip 1/4 (in) (DME) (Generic) Every Other Day/15 Days ary Discharge Instructions: Lightly pack as instructed Secondary Dressing: Bordered Gauze, 4x4 in (DME) (Generic) Every Other Day/15 Days Discharge Instructions: Apply over primary dressing as  directed. Electronic Signature(s) Signed: 07/05/2020 8:51:25 AM By: Geralyn Corwin DO Entered By: Geralyn Corwin on 07/05/2020 08:32:02 -------------------------------------------------------------------------------- Problem List Details Patient Name: Date of Service: Beth, [name] 07/03/2020 2:00 PM Medical Record Number: [id number] Patient Account Number: [id number] Date of Birth/Sex: Treating RN: [birth year] (45 y.o. [name] Primary Care Provider: [name] Other Clinician: Referring Provider: Treating Provider/Extender: Burman Riis in Treatment: 4 Active Problems ICD-10 Encounter Code Description Active Date MDM Diagnosis L98.498 Non-pressure chronic ulcer of skin of other sites with other specified severity 06/04/2020 No Yes L72.3 Sebaceous cyst 06/04/2020 No Yes Inactive Problems Resolved Problems Electronic Signature(s) Signed: 07/05/2020 8:51:25 AM By: Geralyn Corwin DO Entered By: Geralyn Corwin on 07/05/2020 08:29:40 -------------------------------------------------------------------------------- Progress Note Details Patient Name: Date of Service: Tineo, [name] 07/03/2020 2:00 PM Medical Record Number: [id number] Patient Account Number: [id number] Date of Birth/Sex: Treating RN: [birth year] (45 y.o. [name] Primary Care Provider: [name] Other Clinician: Referring Provider: Treating Provider/Extender: Burman Riis in Treatment: 4 Subjective Chief Complaint Information obtained from Patient 06/04/2020; this is a patient with a open wound on the left upper anterior chest History of Present Illness (HPI) ADMISSION 06/04/2020 This is a 45 year old man who is a type II diabetic. Apparently in January noticed some itching on his anterior upper chest scratched incessantly and then opened the wound. According to his primary doctor's notes from 04/20/2020 this presented as a cyst that  either popped or was I indeed. He was placed on antibiotics doxycycline. The patient thinks a culture that was done that was negative although there was a fair amount of drainage. Fortunately the area never really healed. He now has an open area roughly 1 cm in depth and 1.7 cm of undermining at roughly 12:00 Past medical history includes type 2 diabetes, alcohol use, tobacco abuse, left ankle wound, depression, obstructive sleep apnea, hypertension, hyperlipidemia, left ankle Charcot deformity 3/28; abscess or cyst on the left upper chest. This is come in from an undermining area of 1.7-1 this is improved overall circumference seems better. We have  been using silver alginate. No evidence of infection 4/4; abscess or cyst on the left upper chest that went underwent an IandD. I measured the undermining area last time at 1 cm although this is probably a bit optimistic as we got 1.7 today. The orifice is certainly smaller although this tunnels superiorly. There is no tenderness we have been using silver alginate 4/11; Patient had a culture done at last clinic visit that showed few E. coli and staph. He was started on Augmentin and will continue this for 10 days. He continues to use silver alginate every other day however reports issues with keeping the dressing in place due to hair on his chest and small wound opening.He denies any purulent drainage, increased warmth or tenderness to the area. 4/19; patient presents for 1 week follow-up. He has been using iodoform packing to the wound with no issues. He overall feels well. He denies any purulent drainage increased warmth or tenderness to the area. Patient states he feels nauseous on his current antibiotic regiment and has taken about 4 days worth. He does not want to take it anymore. Patient History Information obtained from Patient. Family History Cancer - Mother, Heart Disease - Mother, Hypertension - Mother, No family history of Diabetes,  Hereditary Spherocytosis, Kidney Disease, Lung Disease, Seizures, Stroke, Thyroid Problems, Tuberculosis. Social History Current every day smoker - 1/2 ppd, Marital Status - Single, Alcohol Use - Daily - beer, Drug Use - No History, Caffeine Use - Daily - coffee, tea, soda. Medical History Eyes Denies history of Cataracts, Glaucoma, Optic Neuritis Ear/Nose/Mouth/Throat Denies history of Chronic sinus problems/congestion, Middle ear problems Respiratory Patient has history of Sleep Apnea Cardiovascular Patient has history of Congestive Heart Failure, Hypertension Endocrine Patient has history of Type II Diabetes Denies history of Type I Diabetes Genitourinary Denies history of End Stage Renal Disease Integumentary (Skin) Denies history of History of Burn Musculoskeletal Patient has history of Osteoarthritis Denies history of Gout, Rheumatoid Arthritis, Osteomyelitis Neurologic Patient has history of Neuropathy Denies history of Dementia, Quadriplegia, Paraplegia, Seizure Disorder Oncologic Denies history of Received Chemotherapy, Received Radiation Psychiatric Denies history of Anorexia/bulimia, Confinement Anxiety Hospitalization/Surgery History - tonsillectomy. - tubes in ears. - nasal reconstruction. Medical A Surgical History Notes nd Constitutional Symptoms (General Health) morbid obesity Cardiovascular hyperlipidemia Musculoskeletal charcot left ankle Psychiatric depression Objective Constitutional respirations regular, non-labored and within target range for patient.. Vitals Time Taken: 2:13 PM, Height: 65 in, Source: Stated, Weight: 256 lbs, Source: Stated, BMI: 42.6, Temperature: 98.9 F, Pulse: 94 bpm, Respiratory Rate: 18 breaths/min, Blood Pressure: 162/107 mmHg, Capillary Blood Glucose: 169 mg/dl. General Notes: glucose per pt report this am General Notes: Chest: Small opening with 2 cm tunneling superiorly. He denies any tenderness to palpation. There is no  erythema or increased warmth to the wound site. Drainage is serosanguineous. Integumentary (Hair, Skin) Wound #1 status is Open. Original cause of wound was Bump. The date acquired was: 03/26/2020. The wound has been in treatment 4 weeks. The wound is located on the Left Chest. The wound measures 0.2cm length x 0.2cm width x 0.3cm depth; 0.031cm^2 area and 0.009cm^3 volume. There is Fat Layer (Subcutaneous Tissue) exposed. There is no undermining noted, however, there is tunneling at 12:00 with a maximum distance of 2cm. There is a medium amount of serosanguineous drainage noted. The wound margin is well defined and not attached to the wound base. There is large (67-100%) red granulation within the wound bed. There is no necrotic tissue within the wound bed. Assessment  Active Problems ICD-10 Non-pressure chronic ulcer of skin of other sites with other specified severity Sebaceous cyst Patient presents with improvement to the wound depth. I encouraged continuing packing of the wound to help this heal from bottom to top. He has taken 4 days of augmentin and states he is not tolerating it well. I think at this time he can stop as I see no signs of acute infection. Plan Follow-up Appointments: Return Appointment in 2 weeks. - Dr. Mikey Bussing Bathing/ Shower/ Hygiene: May shower and wash wound with soap and water. - on days that dressing is changed WOUND #1: - Chest Wound Laterality: Left Cleanser: Normal Saline (DME) (Generic) Every Other Day/15 Days Discharge Instructions: Cleanse the wound with Normal Saline prior to applying a clean dressing using gauze sponges, not tissue or cotton balls. Peri-Wound Care: Skin Prep (DME) (Generic) Every Other Day/15 Days Discharge Instructions: Use skin prep as directed Prim Dressing: Iodoform packing strip 1/4 (in) (DME) (Generic) Every Other Day/15 Days ary Discharge Instructions: Lightly pack as instructed Secondary Dressing: Bordered Gauze, 4x4 in (DME)  (Generic) Every Other Day/15 Days Discharge Instructions: Apply over primary dressing as directed. 1. Iodoform packing every other day or as needed 2. Follow-up in 2 weeks 3. Stop Augmentin Electronic Signature(s) Signed: 07/05/2020 8:51:25 AM By: Geralyn Corwin DO Entered By: Geralyn Corwin on 07/05/2020 08:50:10 -------------------------------------------------------------------------------- HxROS Details Patient Name: Date of Service: Syring, [name] 07/03/2020 2:00 PM Medical Record Number: [id number] Patient Account Number: [id number] Date of Birth/Sex: Treating RN: January 24, 1976 (45 y.o. [name] Primary Care Provider: [name] Other Clinician: Referring Provider: Treating Provider/Extender: Burman Riis in Treatment: 4 Information Obtained From Patient Constitutional Symptoms (General Health) Medical History: Past Medical History Notes: morbid obesity Eyes Medical History: Negative for: Cataracts; Glaucoma; Optic Neuritis Ear/Nose/Mouth/Throat Medical History: Negative for: Chronic sinus problems/congestion; Middle ear problems Respiratory Medical History: Positive for: Sleep Apnea Cardiovascular Medical History: Positive for: Congestive Heart Failure; Hypertension Past Medical History Notes: hyperlipidemia Endocrine Medical History: Positive for: Type II Diabetes Negative for: Type I Diabetes Time with diabetes: since 2016 Treated with: Insulin, Oral agents Blood sugar tested every day: No Genitourinary Medical History: Negative for: End Stage Renal Disease Integumentary (Skin) Medical History: Negative for: History of Burn Musculoskeletal Medical History: Positive for: Osteoarthritis Negative for: Gout; Rheumatoid Arthritis; Osteomyelitis Past Medical History Notes: charcot left ankle Neurologic Medical History: Positive for: Neuropathy Negative for: Dementia; Quadriplegia; Paraplegia; Seizure  Disorder Oncologic Medical History: Negative for: Received Chemotherapy; Received Radiation Psychiatric Medical History: Negative for: Anorexia/bulimia; Confinement Anxiety Past Medical History Notes: depression Immunizations Pneumococcal Vaccine: Received Pneumococcal Vaccination: No Implantable Devices None Hospitalization / Surgery History Type of Hospitalization/Surgery tonsillectomy tubes in ears nasal reconstruction Family and Social History Cancer: Yes - Mother; Diabetes: No; Heart Disease: Yes - Mother; Hereditary Spherocytosis: No; Hypertension: Yes - Mother; Kidney Disease: No; Lung Disease: No; Seizures: No; Stroke: No; Thyroid Problems: No; Tuberculosis: No; Current every day smoker - 1/2 ppd; Marital Status - Single; Alcohol Use: Daily - beer; Drug Use: No History; Caffeine Use: Daily - coffee, tea, soda; Financial Concerns: No; Food, Clothing or Shelter Needs: No; Support System Lacking: No; Transportation Concerns: No Electronic Signature(s) Signed: 07/05/2020 8:51:25 AM By: Geralyn Corwin DO Signed: 07/05/2020 5:27:18 PM By: Zandra Abts RN, BSN Entered By: Geralyn Corwin on 07/05/2020 08:30:56 -------------------------------------------------------------------------------- SuperBill Details Patient Name: Date of Service: Crawshaw, [name] 07/03/2020 Medical Record Number: [id number] Patient Account Number: [id number] Date of Birth/Sex: Treating RN: [birth year] (44  y.o. [name], Lauren Primary Care Provider: [name] Other Clinician: Referring Provider: Treating Provider/Extender: Burman Riis in Treatment: 4 Diagnosis Coding ICD-10 Codes Code Description [id number] Non-pressure chronic ulcer of skin of other sites with other specified severity L72.3 Sebaceous cyst Facility Procedures CPT4 Code: [id number] Description: 99213 - WOUND CARE VISIT-LEV 3 EST PT Modifier: Quantity: 1 Electronic Signature(s) Signed:  07/05/2020 8:51:25 AM By: Geralyn Corwin DO Entered By: Geralyn Corwin on 07/05/2020 08:50:53

## 2020-07-17 ENCOUNTER — Other Ambulatory Visit: Payer: Self-pay

## 2020-07-17 ENCOUNTER — Encounter (HOSPITAL_BASED_OUTPATIENT_CLINIC_OR_DEPARTMENT_OTHER): Payer: Medicare Other | Attending: Internal Medicine | Admitting: Internal Medicine

## 2020-07-17 DIAGNOSIS — L98498 Non-pressure chronic ulcer of skin of other sites with other specified severity: Secondary | ICD-10-CM | POA: Diagnosis not present

## 2020-07-17 DIAGNOSIS — E1140 Type 2 diabetes mellitus with diabetic neuropathy, unspecified: Secondary | ICD-10-CM | POA: Diagnosis not present

## 2020-07-17 DIAGNOSIS — L723 Sebaceous cyst: Secondary | ICD-10-CM

## 2020-07-17 DIAGNOSIS — I110 Hypertensive heart disease with heart failure: Secondary | ICD-10-CM | POA: Diagnosis not present

## 2020-07-17 DIAGNOSIS — E11622 Type 2 diabetes mellitus with other skin ulcer: Secondary | ICD-10-CM | POA: Diagnosis not present

## 2020-07-17 DIAGNOSIS — E1151 Type 2 diabetes mellitus with diabetic peripheral angiopathy without gangrene: Secondary | ICD-10-CM | POA: Insufficient documentation

## 2020-07-17 DIAGNOSIS — F17200 Nicotine dependence, unspecified, uncomplicated: Secondary | ICD-10-CM | POA: Insufficient documentation

## 2020-07-17 DIAGNOSIS — I509 Heart failure, unspecified: Secondary | ICD-10-CM | POA: Diagnosis not present

## 2020-07-20 NOTE — Progress Notes (Signed)
Marc, Galloway ([id number]) Visit Report for 07/03/2020 Arrival Information Details Patient Name: Date of Service: Marc, [name] 07/03/2020 2:00 PM Medical Record Number: [id number] Patient Account Number: [id number] Date of Birth/Sex: Treating RN: [birth year] (45 y.o. Marc Galloway, Bonita Quin Primary Care Mostyn Varnell: [name] Other Clinician: Referring Lundon Rosier: Treating Vergie Zahm/Extender: Burman Riis in Treatment: 4 Visit Information History Since Last Visit Added or deleted any medications: No Patient Arrived: Marc Galloway Any new allergies or adverse reactions: No Arrival Time: 14:09 Had a fall or experienced change in No Accompanied By: self activities of daily living that may affect Transfer Assistance: None risk of falls: Patient Identification Verified: Yes Signs or symptoms of abuse/neglect since last visito No Secondary Verification Process Completed: Yes Hospitalized since last visit: No Patient Requires Transmission-Based Precautions: No Implantable device outside of the clinic excluding No Patient Has Alerts: No cellular tissue based products placed in the center since last visit: Has Dressing in Place as Prescribed: Yes Pain Present Now: No Electronic Signature(s) Signed: 07/04/2020 6:20:01 PM By: Zenaida Deed RN, BSN Entered By: Zenaida Deed on 07/03/2020 14:13:48 -------------------------------------------------------------------------------- Clinic Level of Care Assessment Details Patient Name: Date of Service: [name], [name] 07/03/2020 2:00 PM Medical Record Number: [id number] Patient Account Number: [id number] Date of Birth/Sex: Treating RN: 06/09/1975 (45 y.o. Marc Galloway Primary Care Katti Pelle: [name] Other Clinician: Referring Arley Garant: Treating Thadd Apuzzo/Extender: Burman Riis in Treatment: 4 Clinic Level of Care Assessment Items TOOL 4 Quantity Score X- 1 0 Use when only an EandM is  performed on FOLLOW-UP visit ASSESSMENTS - Nursing Assessment / Reassessment X- 1 10 Reassessment of Co-morbidities (includes updates in patient status) X- 1 5 Reassessment of Adherence to Treatment Plan ASSESSMENTS - Wound and Skin A ssessment / Reassessment X - Simple Wound Assessment / Reassessment - one wound 1 5 []  - 0 Complex Wound Assessment / Reassessment - multiple wounds X- 1 10 Dermatologic / Skin Assessment (not related to wound area) ASSESSMENTS - Focused Assessment []  - 0 Circumferential Edema Measurements - multi extremities X- 1 10 Nutritional Assessment / Counseling / Intervention []  - 0 Lower Extremity Assessment (monofilament, tuning fork, pulses) []  - 0 Peripheral Arterial Disease Assessment (using hand held doppler) ASSESSMENTS - Ostomy and/or Continence Assessment and Care []  - 0 Incontinence Assessment and Management []  - 0 Ostomy Care Assessment and Management (repouching, etc.) PROCESS - Coordination of Care X - Simple Patient / Family Education for ongoing care 1 15 []  - 0 Complex (extensive) Patient / Family Education for ongoing care X- 1 10 Staff obtains , Records, T Results / Process Orders est []  - 0 Staff telephones HHA, Nursing Homes / Clarify orders / etc []  - 0 Routine Transfer to another Facility (non-emergent condition) []  - 0 Routine Hospital Admission (non-emergent condition) []  - 0 New Admissions / / Ordering NPWT Apligraf, etc. , []  - 0 Emergency Hospital Admission (emergent condition) X- 1 10 Simple Discharge Coordination []  - 0 Complex (extensive) Discharge Coordination PROCESS - Special Needs []  - 0 Pediatric / Minor Patient Management []  - 0 Isolation Patient Management []  - 0 Hearing / Language / Visual special needs []  - 0 Assessment of Community assistance (transportation, D/C planning, etc.) []  - 0 Additional assistance / Altered mentation []  - 0 Support Surface(s) Assessment  (bed, cushion, seat, etc.) INTERVENTIONS - Wound Cleansing / Measurement X - Simple Wound Cleansing - one wound 1 5 []  - 0 Complex Wound Cleansing - multiple wounds  X- 1 5 Wound Imaging (photographs - any number of wounds) []  - 0 Wound Tracing (instead of photographs) X- 1 5 Simple Wound Measurement - one wound []  - 0 Complex Wound Measurement - multiple wounds INTERVENTIONS - Wound Dressings X - Small Wound Dressing one or multiple wounds 1 10 []  - 0 Medium Wound Dressing one or multiple wounds []  - 0 Large Wound Dressing one or multiple wounds []  - 0 Application of Medications - topical []  - 0 Application of Medications - injection INTERVENTIONS - Miscellaneous []  - 0 External ear exam []  - 0 Specimen Collection (cultures, biopsies, blood, body fluids, etc.) []  - 0 Specimen(s) / Culture(s) sent or taken to Lab for analysis []  - 0 Patient Transfer (multiple staff / / Similar devices) []  - 0 Simple Staple / Suture removal (25 or less) []  - 0 Complex Staple / Suture removal (26 or more) []  - 0 Hypo / Hyperglycemic Management (close monitor of Blood Glucose) []  - 0 Ankle / Brachial Index (ABI) - do not check if billed separately X- 1 5 Vital Signs Has the patient been seen at the hospital within the last three years: Yes Total Score: 105 Level Of Care: New/Established - Level 3 Electronic Signature(s) Signed: 07/20/2020 3:14:58 PM By: RN Entered By: on 07/03/2020 15:20:48 -------------------------------------------------------------------------------- Lower Extremity Assessment Details Patient Name: Date of Service: [name], [name] 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 07/03/2020 2:00 PM Medical Record Number: 07/05/2020 Patient Account Number: 07/05/2020 Date of Birth/Sex: Treating RN: 1975-04-29 (45 y.o. 08/31/1975 Primary Care Keshawn Sundberg: 47 Other Clinician: Referring Salar Molden: Treating Aevah Stansbery/Extender: Damaris Schooner in Treatment: 4 Vital Signs Height(in): 65 Capillary Blood Glucose(mg/dl): [name] Weight(lbs): Burman Riis Pulse(bpm): 94 Body Mass Index(BMI): 43 Blood Pressure(mmHg): 162/107 Temperature(F): 98.9 Respiratory Rate(breaths/min): 18 Photos: [N/A:N/A] Left Chest N/A N/A Wound Location: Bump N/A N/A Wounding Event: Abscess N/A N/A Primary Etiology: Sleep Apnea, Congestive Heart N/A N/A Comorbid History: Failure, Hypertension, Type II Diabetes, Osteoarthritis, Neuropathy 03/26/2020 N/A N/A Date Acquired: 4 N/A N/A Weeks of Treatment: Open N/A N/A Wound Status: 0.2x0.2x0.3 N/A N/A Measurements L x W x D (cm) 0.031 N/A N/A A (cm) : rea 0.009 N/A N/A Volume (cm) : 93.40% N/A N/A % Reduction in A rea: 98.10% N/A N/A % Reduction in Volume: 12 Position 1 (o'clock): 2 Maximum Distance 1 (cm): Yes N/A N/A Tunneling: Full Thickness Without Exposed N/A N/A Classification: Support Structures Medium N/A N/A Exudate Amount: Serosanguineous N/A N/A Exudate Type: red, brown N/A N/A Exudate Color: Well defined, not attached N/A N/A Wound Margin: Large (67-100%) N/A N/A Granulation Amount: Red N/A N/A Granulation Quality: None Present (0%) N/A N/A Necrotic Amount: Fat Layer (Subcutaneous Tissue): Yes N/A N/A Exposed Structures: Fascia: No Tendon: No Muscle:  No Joint: No Bone: No None N/A N/A Epithelialization: Treatment Notes Electronic Signature(s) Signed: 07/05/2020 8:51:25 AM By: Zenaida Deed DO Signed: 07/05/2020 5:27:18 PM By: 07/05/2020 RN, BSN Entered By: [id number] on 07/05/2020 08:29:46 -------------------------------------------------------------------------------- Multi-Disciplinary Care Plan Details Patient Name: Date of Service: Mcnerney, [name] 07/03/2020  2:00 PM Medical Record Number: [id number] Patient Account Number: [id number] Date of Birth/Sex: Treating RN: 03/02/1976 (45 y.o. Marc Galloway Primary Care Collier Bohnet: [name] Other Clinician: Referring Janeah Kovacich: Treating Enrique Manganaro/Extender: Burman Riis in Treatment: 4 Multidisciplinary Care Plan reviewed with physician Active Inactive Nutrition Nursing Diagnoses: Impaired glucose control: actual or potential Potential for alteratiion in Nutrition/Potential for imbalanced nutrition Goals: Patient/caregiver agrees to and verbalizes understanding of need to use nutritional supplements and/or vitamins as prescribed Date Initiated: 06/04/2020 Target Resolution Date: 07/06/2020 Goal Status: Active Patient/caregiver will maintain therapeutic glucose control Date Initiated: 06/04/2020 Target Resolution Date: 07/06/2020 Goal Status: Active Interventions: Assess HgA1c results as ordered upon admission and as needed Assess patient nutrition upon admission and as needed per policy Provide education on elevated blood sugars and impact on wound healing Provide education on nutrition Treatment Activities: Education provided on Nutrition : 06/04/2020 Notes: Wound/Skin Impairment Nursing Diagnoses: Impaired tissue integrity Knowledge deficit related to ulceration/compromised skin integrity Goals: Patient will demonstrate a reduced rate of smoking or cessation of smoking Date Initiated: 06/04/2020 Target Resolution Date:  07/06/2020 Goal Status: Active Patient/caregiver will verbalize understanding of skin care regimen Date Initiated: 06/04/2020 Target Resolution Date: 07/06/2020 Goal Status: Active Ulcer/skin breakdown will have a volume reduction of 30% by week 4 Date Initiated: 06/04/2020 Target Resolution Date: 07/06/2020 Goal Status: Active Interventions: Assess patient/caregiver ability to obtain necessary supplies Assess patient/caregiver ability to perform ulcer/skin care regimen upon admission and as needed Assess ulceration(s) every visit Provide education on smoking Provide education on ulcer and skin care Notes: Electronic Signature(s) Signed: 07/20/2020 3:14:58 PM By: Fonnie Mu RN Entered By: Fonnie Mu on 07/03/2020 15:19:50 -------------------------------------------------------------------------------- Pain Assessment Details Patient Name: Date of Service: Chalfin, [name] 07/03/2020 2:00 PM Medical Record Number: [id number] Patient Account Number: [id number] Date of Birth/Sex: Treating RN: 02-Feb-1976 (45 y.o. Damaris Schooner Primary Care Kentrail Shew: [name] Other Clinician: Referring Bettie Swavely: Treating Dwayne Begay/Extender: Burman Riis in Treatment: 4 Active Problems Location of Pain Severity and Description of Pain Patient Has Paino No Site Locations Rate the pain. Current Pain Level: 0 Pain Management and Medication Current Pain Management: Electronic Signature(s) Signed: 07/04/2020 6:20:01 PM By: Zenaida Deed RN, BSN Entered By: Zenaida Deed on 07/03/2020 14:15:50 -------------------------------------------------------------------------------- Patient/Caregiver Education Details Patient Name: Date of Service: Lilia Pro [name] 4/19/2022andnbsp2:00 PM Medical Record Number: [id number] Patient Account Number: [id number] Date of Birth/Gender: Treating RN: 03/02/1976 (45 y.o. Marc Galloway Primary Care Physician:  [name] Other Clinician: Referring Physician: Treating Physician/Extender: Burman Riis in Treatment: 4 Education Assessment Education Provided To: Patient Education Topics Provided Elevated Blood Sugar/ Impact on Healing: Methods: Explain/Verbal Responses: Reinforcements needed Nutrition: Methods: Explain/Verbal Responses: State content correctly Smoking and Wound Healing: Methods: Explain/Verbal Responses: State content correctly Wound/Skin Impairment: Methods: Explain/Verbal Responses: State content correctly Electronic Signature(s) Signed: 07/20/2020 3:14:58 PM By: Fonnie Mu RN Entered By: Fonnie Mu on 07/03/2020 15:20:16 -------------------------------------------------------------------------------- Wound Assessment Details Patient Name: Date of Service: [place], [name] 07/03/2020 2:00 PM Medical Record Number: [id number] Patient Account Number: [id number] Date of Birth/Sex: Treating RN: [birth year] (46 y.o. Damaris Schooner Primary Care Ellysia Char: [name] Other Clinician: Referring Fidelia Cathers: Treating Raiven Belizaire/Extender: Sallee Provencal Weeks in Treatment: 4 Wound Status Wound Number: 1 Primary Abscess Etiology: Wound Location: Left Chest Wound Open Wounding Event: Bump Status: Status: Date Acquired: 03/26/2020 Comorbid Sleep Apnea, Congestive Heart Failure, Hypertension, Type II Weeks Of Treatment: 4 History: Diabetes, Osteoarthritis, Neuropathy Clustered Wound: No Photos  Wound Measurements Length: (cm) 0.2 Width: (cm) 0.2 Depth: (cm) 0.3 Area: (cm) 0.031 Volume: (cm) 0.009 % Reduction in Area: 93.4% % Reduction in Volume: 98.1% Epithelialization: None Tunneling: Yes Position (o'clock): 12 Maximum Distance: (cm) 2 Undermining: No Wound Description Classification: Full Thickness Without Exposed Support Structures Wound Margin: Well defined, not attached Exudate Amount:  Medium Exudate Type: Serosanguineous Exudate Color: red, brown Foul Odor After Cleansing: No Slough/Fibrino No Wound Bed Granulation Amount: Large (67-100%) Exposed Structure Granulation Quality: Red Fascia Exposed: No Necrotic Amount: None Present (0%) Fat Layer (Subcutaneous Tissue) Exposed: Yes Tendon Exposed: No Muscle Exposed: No Joint Exposed: No Bone Exposed: No Electronic Signature(s) Signed: 07/04/2020 10:33:47 AM By: Karl Itoawkins, Destiny Signed: 07/04/2020 6:20:01 PM By: Zenaida DeedBoehlein, Linda RN, BSN Entered By: Karl Itoawkins, Destiny on 07/03/2020 17:01:37 -------------------------------------------------------------------------------- Vitals Details Patient Name: Date of Service: Disbro, [name] 07/03/2020 2:00 PM Medical Record Number: [id number] Patient Account Number: [id number] Date of Birth/Sex: Treating RN: 01/04/1976 (45 y.o. Damaris SchoonerM) Boehlein, Linda Primary Care Cathye Kreiter: Mila PalmerWolters, Sharon Other Clinician: Referring Renn Dirocco: Treating Sylwia Cuervo/Extender: Burman RiisHoffman, Jessica Wolters, Sharon Weeks in Treatment: 4 Vital Signs Time Taken: 14:13 Temperature (F): 98.9 Height (in): 65 Pulse (bpm): 94 Source: Stated Respiratory Rate (breaths/min): 18 Weight (lbs): 256 Blood Pressure (mmHg): 162/107 Source: Stated Capillary Blood Glucose (mg/dl): 409169 Body Mass Index (BMI): 42.6 Reference Range: 80 - 120 mg / dl Notes glucose per pt report this am Electronic Signature(s) Signed: 07/04/2020 6:20:01 PM By: Zenaida DeedBoehlein, Linda RN, BSN Entered By: Zenaida DeedBoehlein, Linda on 07/03/2020 14:14:44

## 2020-07-20 NOTE — Progress Notes (Signed)
ARVINE, CLAYBURN ([id number]) Visit Report for 07/17/2020 Arrival Information Details Patient Name: Date of Service: Marc Galloway Galloway, Marc Galloway Marc Galloway E. 07/17/2020 1:45 PM Medical Record Number: [id number] Patient Account Number: [id number] Date of Birth/Sex: Treating RN: 02/19/1976 (45 y.o. Janyth Contes Primary Care Levonte Molina: Jonathon Jordan Other Clinician: Referring Kemoni Quesenberry: Treating Fayelynn Distel/Extender: Theodosia Quay in Treatment: 6 Visit Information History Since Last Visit Added or deleted any medications: No Patient Arrived: Ambulatory Any new allergies or adverse reactions: No Arrival Time: 13:42 Had Marc Galloway fall or experienced change in No Accompanied By: alone activities of daily living that may affect Transfer Assistance: None risk of falls: Patient Identification Verified: Yes Signs or symptoms of abuse/neglect since last visito No Secondary Verification Process Completed: Yes Hospitalized since last visit: No Patient Requires Transmission-Based Precautions: No Implantable device outside of the clinic excluding No Patient Has Alerts: No cellular tissue based products placed in the center since last visit: Has Dressing in Place as Prescribed: Yes Pain Present Now: No Electronic Signature(s) Signed: 07/19/2020 5:41:41 PM By: Levan Hurst RN, BSN Entered By: Levan Hurst on 07/17/2020 13:42:25 -------------------------------------------------------------------------------- Clinic Level of Care Assessment Details Patient Name: Date of Service: Marc Galloway Galloway, Marc Galloway Marc Galloway E. 07/17/2020 1:45 PM Medical Record Number: [id number] Patient Account Number: [id number] Date of Birth/Sex: Treating RN: 20-Nov-1975 (45 y.o. Janyth Contes Primary Care Rogelio Waynick: Jonathon Jordan Other Clinician: Referring Nyle Limb: Treating Tiffany Calmes/Extender: Theodosia Quay in Treatment: 6 Clinic Level of Care Assessment Items TOOL 4 Quantity Score X- 1 0 Use when only an EandM is  performed on FOLLOW-UP visit ASSESSMENTS - Nursing Assessment / Reassessment X- 1 10 Reassessment of Co-morbidities (includes updates in patient status) X- 1 5 Reassessment of Adherence to Treatment Plan ASSESSMENTS - Wound Marc Galloway Skin Marc Galloway ssessment / Reassessment X - Simple Wound Assessment / Reassessment - one wound 1 5 _0  - 0 Complex Wound Assessment / Reassessment - multiple wounds _1  - 0 Dermatologic / Skin Assessment (not related to wound area) ASSESSMENTS - Focused Assessment _2  - 0 Circumferential Edema Measurements - multi extremities _3  - 0 Nutritional Assessment / Counseling / Intervention _4  - 0 Lower Extremity Assessment (monofilament, tuning fork, pulses) _5  - 0 Peripheral Arterial Disease Assessment (using hand held doppler) ASSESSMENTS - Ostomy Marc Galloway/or Continence Assessment Marc Galloway Care _6  - 0 Incontinence Assessment Marc Galloway Management _7  - 0 Ostomy Care Assessment Marc Galloway Management (repouching, etc.) PROCESS - Coordination of Care X - Simple Patient / Family Education for ongoing care 1 15 _8  - 0 Complex (extensive) Patient / Family Education for ongoing care X- 1 10 Staff obtains Programmer, systems, Records, T Results / Process Orders est _9  - 0 Staff telephones HHA, Nursing Homes / Clarify orders / etc _10  - 0 Routine Transfer to another Facility (non-emergent condition) _11  - 0 Routine Hospital Admission (non-emergent condition) _12  - 0 New Admissions / Biomedical engineer / Ordering NPWT Apligraf, etc. , _13  - 0 Emergency Hospital Admission (emergent condition) X- 1 10 Simple Discharge Coordination _14  - 0 Complex (extensive) Discharge Coordination PROCESS - Special Needs _15  - 0 Pediatric / Minor Patient Management _16  - 0 Isolation Patient Management _17  - 0 Hearing / Language / Visual special needs _18  - 0 Assessment of Community assistance (transportation, D/C planning, etc.) _19  - 0 Additional assistance / Altered mentation _20  - 0 Support Surface(s) Assessment  (bed, cushion, seat, etc.) INTERVENTIONS - Wound Cleansing / Measurement X - Simple Wound Cleansing - one wound 1 5 _21  - 0 Complex Wound Cleansing - multiple wounds  X- 1 5 Wound Imaging (photographs - any number of wounds) _0  - 0 Wound Tracing (instead of photographs) X- 1 5 Simple Wound Measurement - one wound _1  - 0 Complex Wound Measurement - multiple wounds INTERVENTIONS - Wound Dressings X - Small Wound Dressing one or multiple wounds 1 10 _2  - 0 Medium Wound Dressing one or multiple wounds _3  - 0 Large Wound Dressing one or multiple wounds <PNTIRWERXVQMGQQP>_6<\/PPJKDTOIZTIWPYKD>_9  - 0 Application of Medications - topical <IPJASNKNLZJQBHAL>_9<\/FXTKWIOXBDZHGDJM>_4  - 0 Application of Medications - injection INTERVENTIONS - Miscellaneous _6  - 0 External ear exam _7  - 0 Specimen Collection (cultures, biopsies, blood, body fluids, etc.) _8  - 0 Specimen(s) / Culture(s) sent or taken to Lab for analysis _9  - 0 Patient Transfer (multiple staff / Civil Service fast streamer / Similar devices) _10  - 0 Simple Staple / Suture removal (25 or less) _11  - 0 Complex Staple / Suture removal (26 or more) _12  - 0 Hypo / Hyperglycemic Management (close monitor of Blood Glucose) _13  - 0 Ankle / Brachial Index (ABI) - do not check if billed separately X- 1 5 Vital Signs Has the patient been seen at the hospital within the last three years: Yes Total Score: 85 Level Of Care: New/Established - Level 3 Electronic Signature(s) Signed: 07/19/2020 5:41:41 PM By: Levan Hurst RN, BSN Entered By: Levan Hurst on 07/17/2020 13:55:24 -------------------------------------------------------------------------------- Encounter Discharge Information Details Patient Name: Date of Service: Marc Galloway Dow Marc Galloway E. 07/17/2020 1:45 PM Medical Record Number: [id number] Patient Account Number: [id number] Date of Birth/Sex: Treating RN: 07/01/1975 (45 y.o. Janyth Contes Primary Care Burnell Hurta: Jonathon Jordan Other Clinician: Referring Markez Dowland: Treating Tasmin Exantus/Extender: Theodosia Quay in Treatment: 6 Encounter Discharge Information Items Discharge Condition: Stable Ambulatory Status: Ambulatory Discharge Destination: Home Transportation: Private Auto Accompanied By: alone Schedule Follow-up Appointment: Yes Clinical Summary of Care: Patient Declined Electronic Signature(s) Signed: 07/19/2020 5:41:41 PM By: Levan Hurst RN, BSN Entered By: Levan Hurst on 07/17/2020 14:06:09 -------------------------------------------------------------------------------- Multi Wound Chart Details Patient Name: Date of Service: Marc Galloway Dow Marc Galloway E. 07/17/2020 1:45 PM Medical Record Number: [id number] Patient Account Number: [id number] Date of Birth/Sex: Treating RN: Jun 15, 1975 (45 y.o. Burnadette Pop, Lauren Primary Care Taneil Lazarus: Jonathon Jordan Other Clinician: Referring Maebel Marasco: Treating Raziyah Vanvleck/Extender: Theodosia Quay in Treatment: 6 Vital Signs Height(in): 65 Capillary Blood Glucose(mg/dl): 200 Weight(lbs): 256 Pulse(bpm): 6 Body Mass Index(BMI): 103 Blood Pressure(mmHg): 171/111 Temperature(F): 99.0 Respiratory Rate(breaths/min): 18 Photos: [1:No Photos Left Chest] [N/Marc Galloway:N/Marc Galloway N/Marc Galloway] Wound Location: [1:Bump] [N/Marc Galloway:N/Marc Galloway] Wounding Event: [1:Abscess] [N/Marc Galloway:N/Marc Galloway] Primary Etiology: [1:Sleep Apnea, Congestive Heart] [N/Marc Galloway:N/Marc Galloway] Comorbid History: [1:Failure, Hypertension, Type II Diabetes, Osteoarthritis, Neuropathy 03/26/2020] [N/Marc Galloway:N/Marc Galloway] Date Acquired: [1:6] [N/Marc Galloway:N/Marc Galloway] Weeks of Treatment: [1:Open] [N/Marc Galloway:N/Marc Galloway] Wound Status: [1:0.2x0.2x0.8] [N/Marc Galloway:N/Marc Galloway] Measurements L x W x D (cm) [1:0.031] [N/Marc Galloway:N/Marc Galloway] Marc Galloway (cm) : rea [1:0.025] [N/Marc Galloway:N/Marc Galloway] Volume (cm) : [1:93.40%] [N/Marc Galloway:N/Marc Galloway] % Reduction in Area: [1:94.70%] [N/Marc Galloway:N/Marc Galloway] % Reduction in Volume: [1:Full Thickness Without Exposed] [N/Marc Galloway:N/Marc Galloway] Classification: [1:Support Structures Small] [N/Marc Galloway:N/Marc Galloway] Exudate Amount: [1:Serosanguineous] [N/Marc Galloway:N/Marc Galloway] Exudate Type: [1:red, brown] [N/Marc Galloway:N/Marc Galloway] Exudate Color: [1:Well defined, not  attached] [N/Marc Galloway:N/Marc Galloway] Wound Margin: [1:Large (67-100%)] [N/Marc Galloway:N/Marc Galloway] Granulation Amount: [1:Pink] [N/Marc Galloway:N/Marc Galloway] Granulation Quality: [1:None Present (0%)] [N/Marc Galloway:N/Marc Galloway] Necrotic Amount: [1:Fat Layer (Subcutaneous Tissue): Yes N/Marc Galloway] Exposed Structures: [1:Fascia: No Tendon: No Muscle: No Joint: No Bone: No Large (67-100%)] [N/Marc Galloway:N/Marc Galloway] Treatment Notes Wound #1 (Chest) Wound Laterality: Left Cleanser Normal Saline Discharge Instruction: Cleanse the wound with Normal Saline prior to applying Marc Galloway clean dressing using gauze sponges, not tissue or cotton balls. Peri-Wound Care Skin Prep Discharge Instruction: Use skin prep as directed Topical Primary Dressing  Iodoform packing strip 1/4 (in) Discharge Instruction: Lightly pack as instructed Secondary Dressing Bordered Gauze, 4x4 in Discharge Instruction: Apply over primary dressing as directed. Secured With Compression Wrap Compression Stockings Add-Ons Electronic Signature(s) Signed: 07/17/2020 2:19:03 PM By: Kalman Shan DO Signed: 07/20/2020 3:14:58 PM By: Rhae Hammock RN Entered By: Kalman Shan on 07/17/2020 14:14:00 -------------------------------------------------------------------------------- Multi-Disciplinary Care Plan Details Patient Name: Date of Service: Marc Galloway Dow Marc Galloway E. 07/17/2020 1:45 PM Medical Record Number: [id number] Patient Account Number: [id number] Date of Birth/Sex: Treating RN: 1976/01/09 (45 y.o. Janyth Contes Primary Care Rhythm Wigfall: Jonathon Jordan Other Clinician: Referring Layna Roeper: Treating Tyron Manetta/Extender: Theodosia Quay in Treatment: [place] reviewed with physician Active Inactive Nutrition Nursing Diagnoses: Impaired glucose control: actual or potential Potential for alteratiion in Nutrition/Potential for imbalanced nutrition Goals: Patient/caregiver agrees to Marc Galloway verbalizes understanding of need to use nutritional supplements Marc Galloway/or vitamins as  prescribed Date Initiated: 06/04/2020 Date Inactivated: 07/17/2020 Target Resolution Date: 07/06/2020 Goal Status: Met Patient/caregiver will maintain therapeutic glucose control Date Initiated: 06/04/2020 Target Resolution Date: 08/03/2020 Goal Status: Active Interventions: Assess HgA1c results as ordered upon admission Marc Galloway as needed Assess patient nutrition upon admission Marc Galloway as needed per policy Provide education on elevated blood sugars Marc Galloway impact on wound healing Provide education on nutrition Treatment Activities: Education provided on Nutrition : 07/03/2020 Notes: Wound/Skin Impairment Nursing Diagnoses: Impaired tissue integrity Knowledge deficit related to ulceration/compromised skin integrity Goals: Patient will demonstrate Marc Galloway reduced rate of smoking or cessation of smoking Date Initiated: 06/04/2020 Target Resolution Date: 08/03/2020 Goal Status: Active Patient/caregiver will verbalize understanding of skin care regimen Date Initiated: 06/04/2020 Target Resolution Date: 08/03/2020 Goal Status: Active Ulcer/skin breakdown will have Marc Galloway volume reduction of 30% by week 4 Date Initiated: 06/04/2020 Date Inactivated: 07/17/2020 Target Resolution Date: 07/06/2020 Goal Status: Met Interventions: Assess patient/caregiver ability to obtain necessary supplies Assess patient/caregiver ability to perform ulcer/skin care regimen upon admission Marc Galloway as needed Assess ulceration(s) every visit Provide education on smoking Provide education on ulcer Marc Galloway skin care Notes: Electronic Signature(s) Signed: 07/19/2020 5:41:41 PM By: Levan Hurst RN, BSN Entered By: Levan Hurst on 07/17/2020 14:05:17 -------------------------------------------------------------------------------- Pain Assessment Details Patient Name: Date of Service: Marc Galloway Dow Marc Galloway E. 07/17/2020 1:45 PM Medical Record Number: [id number] Patient Account Number: [id number] Date of Birth/Sex: Treating RN: 08-22-1975 (45 y.o. Janyth Contes Primary Care Jesson Foskey: Jonathon Jordan Other Clinician: Referring Gaines Cartmell: Treating Kiara Keep/Extender: Theodosia Quay in Treatment: 6 Active Problems Location of Pain Severity Marc Galloway Description of Pain Patient Has Paino No Site Locations Pain Management Marc Galloway Medication Current Pain Management: Electronic Signature(s) Signed: 07/19/2020 5:41:41 PM By: Levan Hurst RN, BSN Entered By: Levan Hurst on 07/17/2020 13:46:06 -------------------------------------------------------------------------------- Patient/Caregiver Education Details Patient Name: Date of Service: Marc Galloway Galloway 5/3/2022andnbsp1:45 PM Medical Record Number: [id number] Patient Account Number: [id number] Date of Birth/Gender: Treating RN: June 17, 1975 (45 y.o. Janyth Contes Primary Care Physician: Jonathon Jordan Other Clinician: Referring Physician: Treating Physician/Extender: Theodosia Quay in Treatment: 6 Education Assessment Education Provided To: Patient Education Topics Provided Wound/Skin Impairment: Methods: Explain/Verbal Responses: State content correctly Electronic Signature(s) Signed: 07/19/2020 5:41:41 PM By: Levan Hurst RN, BSN Entered By: Levan Hurst on 07/17/2020 13:49:58 -------------------------------------------------------------------------------- Wound Assessment Details Patient Name: Date of Service: Mccollum, Marc Galloway Marc Galloway E. 07/17/2020 1:45 PM Medical Record Number: [id number] Patient Account Number: [id number] Date of Birth/Sex: Treating RN: [birth year] (45 y.o. Janyth Contes Primary Care Shayde Gervacio: Jonathon Jordan Other Clinician: Referring Jaileigh Weimer: Treating Jona Erkkila/Extender: Theodosia Quay in  Treatment: 6 Wound Status Wound Number: 1 Primary Abscess Etiology: Wound Location: Left Chest Wound Open Wounding Event: Bump Status: Date Acquired: 03/26/2020 Comorbid Sleep Apnea, Congestive Heart  Failure, Hypertension, Type II Weeks Of Treatment: 6 History: Diabetes, Osteoarthritis, Neuropathy Clustered Wound: No Photos Wound Measurements Length: (cm) 0.2 Width: (cm) 0.2 Depth: (cm) 0.8 Area: (cm) 0.031 Volume: (cm) 0.025 % Reduction in Area: 93.4% % Reduction in Volume: 94.7% Epithelialization: Large (67-100%) Tunneling: No Undermining: No Wound Description Classification: Full Thickness Without Exposed Support Structures Wound Margin: Well defined, not attached Exudate Amount: Small Exudate Type: Serosanguineous Exudate Color: red, brown Foul Odor After Cleansing: No Slough/Fibrino No Wound Bed Granulation Amount: Large (67-100%) Exposed Structure Granulation Quality: Pink Fascia Exposed: No Necrotic Amount: None Present (0%) Fat Layer (Subcutaneous Tissue) Exposed: Yes Tendon Exposed: No Muscle Exposed: No Joint Exposed: No Bone Exposed: No Treatment Notes Wound #1 (Chest) Wound Laterality: Left Cleanser Normal Saline Discharge Instruction: Cleanse the wound with Normal Saline prior to applying Marc Galloway clean dressing using gauze sponges, not tissue or cotton balls. Peri-Wound Care Skin Prep Discharge Instruction: Use skin prep as directed Topical Primary Dressing Iodoform packing strip 1/4 (in) Discharge Instruction: Lightly pack as instructed Secondary Dressing Bordered Gauze, 4x4 in Discharge Instruction: Apply over primary dressing as directed. Secured With Compression Wrap Compression Stockings Environmental education officer) Signed: 07/17/2020 4:47:39 PM By: Sandre Kitty Signed: 07/19/2020 5:41:41 PM By: Levan Hurst RN, BSN Entered By: Sandre Kitty on 07/17/2020 16:45:41 -------------------------------------------------------------------------------- Vitals Details Patient Name: Date of Service: Galloway, Marc Galloway Marc Galloway E. 07/17/2020 1:45 PM Medical Record Number: [id number] Patient Account Number: [id number] Date of Birth/Sex: Treating  RN: 09-02-1975 (45 y.o. Janyth Contes Primary Care Miabella Shannahan: Jonathon Jordan Other Clinician: Referring Skarlet Lyons: Treating Felipa Laroche/Extender: Theodosia Quay in Treatment: 6 Vital Signs Time Taken: 13:43 Temperature (F): 99.0 Height (in): 65 Pulse (bpm): 91 Weight (lbs): 256 Respiratory Rate (breaths/min): 18 Body Mass Index (BMI): 42.6 Blood Pressure (mmHg): 171/111 Capillary Blood Glucose (mg/dl): 200 Reference Range: 80 - 120 mg / dl Notes MD notified of elevated BP, pt states he has not taken BP meds today. Electronic Signature(s) Signed: 07/19/2020 5:41:41 PM By: Levan Hurst RN, BSN Entered By: Levan Hurst on 07/17/2020 14:11:08

## 2020-07-20 NOTE — Progress Notes (Signed)
Marc Galloway ([id number]) Visit Report for 07/17/2020 Chief Complaint Document Details Patient Name: Date of Service: Marc Galloway, Marc [name] 07/17/2020 1:45 PM Medical Record Number: [id number] Patient Account Number: [id number] Date of Birth/Sex: Treating RN: 11/14/1975 (45 y.o. Marc Galloway, Marc Galloway Primary Care Provider: Mila Galloway Other Clinician: Referring Provider: Treating Provider/Extender: Marc Galloway in Treatment: Galloway Information Obtained from: Patient Chief Complaint 06/04/2020; this is Marc patient with Marc open wound on the left upper anterior chest Electronic Signature(s) Signed: 07/17/2020 2:19:03 PM By: Marc Corwin DO Entered By: Marc Galloway on 07/17/2020 14:14:12 -------------------------------------------------------------------------------- HPI Details Patient Name: Date of Service: Marc Galloway, Marc [name] 07/17/2020 1:45 PM Medical Record Number: [id number] Patient Account Number: [id number] Date of Birth/Sex: Treating RN: 01/15/1976 (45 y.o. Marc Galloway Primary Care Provider: Mila Galloway Other Clinician: Referring Provider: Treating Provider/Extender: Marc Galloway in Treatment: Galloway History of Present Illness HPI Description: ADMISSION 06/04/2020 This is Marc 45 year old man who is Marc type II diabetic. Apparently in January noticed some itching on his anterior upper chest scratched incessantly and then opened the wound. According to his primary doctor's notes from 04/20/2020 this presented as Marc cyst that either popped or was I indeed. He was placed on antibiotics doxycycline. The patient thinks Marc culture that was done that was negative although there was Marc fair amount of drainage. Fortunately the area never really healed. He now has an open area roughly 1 cm in depth and 1.7 cm of undermining at roughly 12:00 Past medical history includes type 2 diabetes, alcohol use, tobacco abuse, left ankle wound, depression,  obstructive sleep apnea, hypertension, hyperlipidemia, left ankle Charcot deformity 3/28; abscess or cyst on the left upper chest. This is come in from an undermining area of 1.7-1 this is improved overall circumference seems better. We have been using silver alginate. No evidence of infection 4/4; abscess or cyst on the left upper chest that went underwent an IandD. I measured the undermining area last time at 1 cm although this is probably Marc bit optimistic as we got 1.7 today. The orifice is certainly smaller although this tunnels superiorly. There is no tenderness we have been using silver alginate 4/11; Patient had Marc culture done at last clinic visit that showed few E. coli and staph. He was started on Augmentin and will continue this for 10 days. He continues to use silver alginate every other day however reports issues with keeping the dressing in place due to hair on his chest and small wound opening.He denies any purulent drainage, increased warmth or tenderness to the area. 4/19; patient presents for 1 week follow-up. He has been using iodoform packing to the wound with no issues. He overall feels well. He denies any purulent drainage increased warmth or tenderness to the area. Patient states he feels nauseous on his current antibiotic regiment and has taken about 4 days worth. He does not want to take it anymore. 5/30; patient presents for 2-week follow-up. He has been using iodoform packing but stopped about 1 week ago because he says it closed over. He overall feels well today and has no complaints. He denies any signs of infection. Electronic Signature(s) Signed: 07/17/2020 2:19:03 PM By: Marc Corwin DO Signed: 07/17/2020 2:19:03 PM By: Marc Corwin DO Entered By: Marc Galloway on 07/17/2020 14:14:51 -------------------------------------------------------------------------------- Physical Exam Details Patient Name: Date of Service: Marc Galloway, Marc [name] 07/17/2020 1:45 PM Medical  Record Number: [id number] Patient Account Number: [id number] Date of Birth/Sex: Treating RN: 04-03-1975 (45  y.o. Marc Galloway Primary Care Provider: Mila Galloway Other Clinician: Referring Provider: Treating Provider/Extender: Marc Galloway Constitutional respirations regular, non-labored and within target range for patient.[name] Psychiatric pleasant and cooperative. Notes Chest: There is Marc small opening with 0.8 cm tunneling superiorly. There is no induration or tenderness to palpation. There is no erythema or increased warmth to the wound site. There is no drainage. Electronic Signature(s) Signed: 07/17/2020 2:19:03 PM By: Marc Corwin DO Entered By: Marc Galloway on 07/17/2020 14:15:45 -------------------------------------------------------------------------------- Physician Orders Details Patient Name: Date of Service: Marc Galloway, Marc [name] 07/17/2020 1:45 PM Medical Record Number: [id number] Patient Account Number: [id number] Date of Birth/Sex: Treating RN: June 12, 1975 (45 y.o. Marc Galloway Primary Care Provider: Mila Galloway Other Clinician: Referring Provider: Treating Provider/Extender: Marc Galloway in Treatment: Galloway Verbal / Phone Orders: No Diagnosis Coding ICD-10 Coding Code Description L98.498 Non-pressure chronic ulcer of skin of other sites with other specified severity L72.3 Sebaceous cyst Follow-up Appointments ppointment in 2 Galloway. - Marc Galloway Return Marc Bathing/ Shower/ Hygiene May shower and wash wound with soap and water. - on days that dressing is changed Wound Treatment Wound #1 - Chest Wound Laterality: Left Cleanser: Normal Saline (Generic) Every Other Day/15 Days Discharge Instructions: Cleanse the wound with Normal Saline prior to applying Marc clean dressing using gauze sponges, not tissue or cotton balls. Peri-Wound Care: Skin Prep (Generic) Every Other Day/15 Days Discharge  Instructions: Use skin prep as directed Prim Dressing: Iodoform packing strip 1/4 (in) (Generic) Every Other Day/15 Days ary Discharge Instructions: Lightly pack as instructed Secondary Dressing: Bordered Gauze, 4x4 in (Generic) Every Other Day/15 Days Discharge Instructions: Apply over primary dressing as directed. Electronic Signature(s) Signed: 07/17/2020 2:19:03 PM By: Marc Corwin DO Entered By: Marc Galloway on 07/17/2020 14:15:56 -------------------------------------------------------------------------------- Problem List Details Patient Name: Date of Service: Marc Galloway, Marc [name] 07/17/2020 1:45 PM Medical Record Number: [id number] Patient Account Number: [id number] Date of Birth/Sex: Treating RN: Jan 29, 1976 (45 y.o. Marc Galloway Primary Care Provider: Mila Galloway Other Clinician: Referring Provider: Treating Provider/Extender: Marc Galloway in Treatment: Galloway Active Problems ICD-10 Encounter Code Description Active Date MDM Diagnosis L98.498 Non-pressure chronic ulcer of skin of other sites with other specified severity 06/04/2020 No Yes L72.3 Sebaceous cyst 06/04/2020 No Yes Inactive Problems Resolved Problems Electronic Signature(s) Signed: 07/17/2020 2:19:03 PM By: Marc Corwin DO Entered By: Marc Galloway on 07/17/2020 14:13:51 -------------------------------------------------------------------------------- Progress Note Details Patient Name: Date of Service: Marc Galloway, Marc [name] 07/17/2020 1:45 PM Medical Record Number: [id number] Patient Account Number: [id number] Date of Birth/Sex: Treating RN: 1975/06/16 (45 y.o. Marc Galloway, Marc Galloway Primary Care Provider: Mila Galloway Other Clinician: Referring Provider: Treating Provider/Extender: Marc Galloway in Treatment: Galloway Subjective Chief Complaint Information obtained from Patient 06/04/2020; this is Marc patient with Marc open wound on the left upper anterior  chest History of Present Illness (HPI) ADMISSION 06/04/2020 This is Marc 45 year old man who is Marc type II diabetic. Apparently in January noticed some itching on his anterior upper chest scratched incessantly and then opened the wound. According to his primary doctor's notes from 04/20/2020 this presented as Marc cyst that either popped or was I indeed. He was placed on antibiotics doxycycline. The patient thinks Marc culture that was done that was negative although there was Marc fair amount of drainage. Fortunately the area never really healed. He now has an open area roughly 1 cm in depth and 1.7 cm of undermining at roughly  12:00 Past medical history includes type 2 diabetes, alcohol use, tobacco abuse, left ankle wound, depression, obstructive sleep apnea, hypertension, hyperlipidemia, left ankle Charcot deformity 3/28; abscess or cyst on the left upper chest. This is come in from an undermining area of 1.7-1 this is improved overall circumference seems better. We have been using silver alginate. No evidence of infection 4/4; abscess or cyst on the left upper chest that went underwent an IandD. I measured the undermining area last time at 1 cm although this is probably Marc bit optimistic as we got 1.7 today. The orifice is certainly smaller although this tunnels superiorly. There is no tenderness we have been using silver alginate 4/11; Patient had Marc culture done at last clinic visit that showed few E. coli and staph. He was started on Augmentin and will continue this for 10 days. He continues to use silver alginate every other day however reports issues with keeping the dressing in place due to hair on his chest and small wound opening.He denies any purulent drainage, increased warmth or tenderness to the area. 4/19; patient presents for 1 week follow-up. He has been using iodoform packing to the wound with no issues. He overall feels well. He denies any purulent drainage increased warmth or tenderness to the  area. Patient states he feels nauseous on his current antibiotic regiment and has taken about 4 days worth. He does not want to take it anymore. 5/30; patient presents for 2-week follow-up. He has been using iodoform packing but stopped about 1 week ago because he says it closed over. He overall feels well today and has no complaints. He denies any signs of infection. Patient History Information obtained from Patient. Family History Cancer - Mother, Heart Disease - Mother, Hypertension - Mother, No family history of Diabetes, Hereditary Spherocytosis, Kidney Disease, Lung Disease, Seizures, Stroke, Thyroid Problems, Tuberculosis. Social History Current every day smoker - 1/2 ppd, Marital Status - Single, Alcohol Use - Daily - beer, Drug Use - No History, Caffeine Use - Daily - coffee, tea, soda. Medical History Eyes Denies history of Cataracts, Glaucoma, Optic Neuritis Ear/Nose/Mouth/Throat Denies history of Chronic sinus problems/congestion, Middle ear problems Respiratory Patient has history of Sleep Apnea Cardiovascular Patient has history of Congestive Heart Failure, Hypertension Endocrine Patient has history of Type II Diabetes Denies history of Type I Diabetes Genitourinary Denies history of End Stage Renal Disease Integumentary (Skin) Denies history of History of Burn Musculoskeletal Patient has history of Osteoarthritis Denies history of Gout, Rheumatoid Arthritis, Osteomyelitis Neurologic Patient has history of Neuropathy Denies history of Dementia, Quadriplegia, Paraplegia, Seizure Disorder Oncologic Denies history of Received Chemotherapy, Received Radiation Psychiatric Denies history of Anorexia/bulimia, Confinement Anxiety Hospitalization/Surgery History - tonsillectomy. - tubes in ears. - nasal reconstruction. Medical Marc Surgical History Notes nd Constitutional Symptoms (General Health) morbid obesity Cardiovascular hyperlipidemia Musculoskeletal charcot left  ankle Psychiatric depression Objective Constitutional respirations regular, non-labored and within target range for patient.. Vitals Time Taken: 1:43 PM, Height: 65 in, Weight: 256 lbs, BMI: 42.Galloway, Temperature: 99.0 F, Pulse: 91 bpm, Respiratory Rate: 18 breaths/min, Blood Pressure: 171/111 mmHg, Capillary Blood Glucose: 200 mg/dl. General Notes: MD notified of elevated BP, pt states he has not taken BP meds today. Psychiatric pleasant and cooperative. General Notes: Chest: There is Marc small opening with 0.8 cm tunneling superiorly. There is no induration or tenderness to palpation. There is no erythema or increased warmth to the wound site. There is no drainage. Integumentary (Hair, Skin) Wound #1 status is Open. Original cause of  wound was Bump. The date acquired was: 03/26/2020. The wound has been in treatment Galloway Galloway. The wound is located on the Left Chest. The wound measures 0.2cm length x 0.2cm width x 0.8cm depth; 0.031cm^2 area and 0.025cm^3 volume. There is Fat Layer (Subcutaneous Tissue) exposed. There is no tunneling or undermining noted. There is Marc small amount of serosanguineous drainage noted. The wound margin is well defined and not attached to the wound base. There is large (67-100%) pink granulation within the wound bed. There is no necrotic tissue within the wound bed. Assessment Active Problems ICD-10 Non-pressure chronic ulcer of skin of other sites with other specified severity Sebaceous cyst Patient's wound has improved significantly in size over the course of his admission. It was closed over but upon probing it does have some depth to it still. I recommended he continue to pack this with iodoform. There were no signs of infection today. This will likely be healed by the time I see him at the next visit in 2 Galloway. Plan Follow-up Appointments: Return Appointment in 2 Galloway. - Marc Galloway Bathing/ Shower/ Hygiene: May shower and wash wound with soap and water. - on  days that dressing is changed WOUND #1: - Chest Wound Laterality: Left Cleanser: Normal Saline (Generic) Every Other Day/15 Days Discharge Instructions: Cleanse the wound with Normal Saline prior to applying Marc clean dressing using gauze sponges, not tissue or cotton balls. Peri-Wound Care: Skin Prep (Generic) Every Other Day/15 Days Discharge Instructions: Use skin prep as directed Prim Dressing: Iodoform packing strip 1/4 (in) (Generic) Every Other Day/15 Days ary Discharge Instructions: Lightly pack as instructed Secondary Dressing: Bordered Gauze, 4x4 in (Generic) Every Other Day/15 Days Discharge Instructions: Apply over primary dressing as directed. 1. Iodoform packing 2. Follow-up in 2 Galloway Electronic Signature(s) Signed: 07/17/2020 2:19:03 PM By: Marc Corwin DO Entered By: Marc Galloway on 07/17/2020 14:17:23 -------------------------------------------------------------------------------- HxROS Details Patient Name: Date of Service: Marc Galloway, Marc [name] 07/17/2020 1:45 PM Medical Record Number: [id number] Patient Account Number: [id number] Date of Birth/Sex: Treating RN: Jun 18, 1975 (45 y.o. Marc Galloway, Marc Galloway Primary Care Provider: Mila Galloway Other Clinician: Referring Provider: Treating Provider/Extender: Marc Galloway in Treatment: Galloway Information Obtained From Patient Constitutional Symptoms (General Health) Medical History: Past Medical History Notes: morbid obesity Eyes Medical History: Negative for: Cataracts; Glaucoma; Optic Neuritis Ear/Nose/Mouth/Throat Medical History: Negative for: Chronic sinus problems/congestion; Middle ear problems Respiratory Medical History: Positive for: Sleep Apnea Cardiovascular Medical History: Positive for: Congestive Heart Failure; Hypertension Past Medical History Notes: hyperlipidemia Endocrine Medical History: Positive for: Type II Diabetes Negative for: Type I Diabetes Time with  diabetes: since 2016 Treated with: Insulin, Oral agents Blood sugar tested every day: No Genitourinary Medical History: Negative for: End Stage Renal Disease Integumentary (Skin) Medical History: Negative for: History of Burn Musculoskeletal Medical History: Positive for: Osteoarthritis Negative for: Gout; Rheumatoid Arthritis; Osteomyelitis Past Medical History Notes: charcot left ankle Neurologic Medical History: Positive for: Neuropathy Negative for: Dementia; Quadriplegia; Paraplegia; Seizure Disorder Oncologic Medical History: Negative for: Received Chemotherapy; Received Radiation Psychiatric Medical History: Negative for: Anorexia/bulimia; Confinement Anxiety Past Medical History Notes: depression Immunizations Pneumococcal Vaccine: Received Pneumococcal Vaccination: No Implantable Devices None Hospitalization / Surgery History Type of Hospitalization/Surgery tonsillectomy tubes in ears nasal reconstruction Family and Social History Cancer: Yes - Mother; Diabetes: No; Heart Disease: Yes - Mother; Hereditary Spherocytosis: No; Hypertension: Yes - Mother; Kidney Disease: No; Lung Disease: No; Seizures: No; Stroke: No; Thyroid Problems: No; Tuberculosis: No; Current every day smoker - 1/2  ppd; Marital Status - Single; Alcohol Use: Daily - beer; Drug Use: No History; Caffeine Use: Daily - coffee, tea, soda; Financial Concerns: No; Food, Clothing or Shelter Needs: No; Support System Lacking: No; Transportation Concerns: No Electronic Signature(s) Signed: 07/17/2020 2:19:03 PM By: Marc CorwinHoffman, Alfonzia Woolum DO Signed: 5/Galloway/2022 3:14:58 PM By: Fonnie MuBreedlove, Lauren RN Entered By: Marc CorwinHoffman, Eldra Word on 07/17/2020 14:15:03 -------------------------------------------------------------------------------- SuperBill Details Patient Name: Date of Service: Marc Galloway, Marc [name] 07/17/2020 Medical Record Number: [id number] Patient Account Number: [id number] Date of Birth/Sex: Treating RN: [birth year]  (45 y.o. Marc KollerM) Lynch, Shatara Primary Care Provider: Mila PalmerWolters, Sharon Other Clinician: Referring Provider: Treating Provider/Extender: Marc RiisHoffman, Ashe Graybeal Wolters, Sharon Galloway in Treatment: Galloway Diagnosis Coding ICD-10 Codes Code Description [id number] Non-pressure chronic ulcer of skin of other sites with other specified severity L72.3 Sebaceous cyst Facility Procedures CPT4 Code: [id number] Description: 99213 - WOUND CARE VISIT-LEV 3 EST PT Modifier: Quantity: 1 Physician Procedures : CPT4 Code Description Modifier [id number] 99213 - WC PHYS LEVEL 3 - EST PT ICD-10 Diagnosis Description L98.498 Non-pressure chronic ulcer of skin of other sites with other specified severity L72.3 Sebaceous cyst Quantity: 1 Electronic Signature(s) Signed: 07/17/2020 2:19:03 PM By: Marc CorwinHoffman, Tomothy Eddins DO Entered By: Marc CorwinHoffman, Rehman Levinson on 07/17/2020 14:18:14

## 2020-07-26 DIAGNOSIS — Z9119 Patient's noncompliance with other medical treatment and regimen: Secondary | ICD-10-CM | POA: Diagnosis not present

## 2020-07-26 DIAGNOSIS — Z7984 Long term (current) use of oral hypoglycemic drugs: Secondary | ICD-10-CM | POA: Diagnosis not present

## 2020-07-26 DIAGNOSIS — E1169 Type 2 diabetes mellitus with other specified complication: Secondary | ICD-10-CM | POA: Diagnosis not present

## 2020-07-31 ENCOUNTER — Encounter (HOSPITAL_BASED_OUTPATIENT_CLINIC_OR_DEPARTMENT_OTHER): Payer: Medicare Other | Admitting: Internal Medicine

## 2020-07-31 ENCOUNTER — Other Ambulatory Visit: Payer: Self-pay

## 2020-07-31 DIAGNOSIS — L98498 Non-pressure chronic ulcer of skin of other sites with other specified severity: Secondary | ICD-10-CM | POA: Diagnosis not present

## 2020-07-31 DIAGNOSIS — F17200 Nicotine dependence, unspecified, uncomplicated: Secondary | ICD-10-CM | POA: Diagnosis not present

## 2020-07-31 DIAGNOSIS — I509 Heart failure, unspecified: Secondary | ICD-10-CM | POA: Diagnosis not present

## 2020-07-31 DIAGNOSIS — I110 Hypertensive heart disease with heart failure: Secondary | ICD-10-CM | POA: Diagnosis not present

## 2020-07-31 DIAGNOSIS — E1151 Type 2 diabetes mellitus with diabetic peripheral angiopathy without gangrene: Secondary | ICD-10-CM | POA: Diagnosis not present

## 2020-07-31 DIAGNOSIS — E11622 Type 2 diabetes mellitus with other skin ulcer: Secondary | ICD-10-CM | POA: Diagnosis not present

## 2020-07-31 DIAGNOSIS — E1140 Type 2 diabetes mellitus with diabetic neuropathy, unspecified: Secondary | ICD-10-CM | POA: Diagnosis not present

## 2020-07-31 DIAGNOSIS — L723 Sebaceous cyst: Secondary | ICD-10-CM | POA: Diagnosis not present

## 2020-07-31 NOTE — Progress Notes (Signed)
Marc Galloway Galloway, Marc Galloway Galloway ([id number]) Visit Report for 07/31/2020 Chief Complaint Document Details Patient Name: Date of Service: Marc Galloway Galloway, Marc Galloway Marc Galloway E. 07/31/2020 1:00 PM Medical Record Number: [id number] Patient Account Number: [id number] Date of Birth/Sex: Treating RN: [birth year] (45 y.o. Marc Galloway Galloway, Marc Galloway Galloway Primary Care Provider: Mila Galloway Other Clinician: Referring Provider: Treating Provider/Extender: Marc Galloway Galloway in Treatment: 8 Information Obtained from: Patient Chief Complaint 06/04/2020; this is Marc Galloway patient with Marc Galloway open wound on the left upper anterior chest Electronic Signature(s) Signed: 07/31/2020 2:26:20 PM By: Marc Galloway Corwin DO Entered By: Marc Galloway Galloway on 07/31/2020 13:59:35 -------------------------------------------------------------------------------- HPI Details Patient Name: Date of Service: Marc Galloway Galloway, Marc Galloway Marc Galloway E. 07/31/2020 1:00 PM Medical Record Number: [id number] Patient Account Number: [id number] Date of Birth/Sex: Treating RN: 1976/03/06 (45 y.o. Marc Galloway Galloway Primary Care Provider: Mila Galloway Other Clinician: Referring Provider: Treating Provider/Extender: Marc Galloway Galloway in Treatment: 8 History of Present Illness HPI Description: ADMISSION 06/04/2020 This is Marc Galloway 45 year old man who is Marc Galloway type II diabetic. Apparently in January noticed some itching on his anterior upper chest scratched incessantly and then opened the wound. According to his primary doctor's notes from 04/20/2020 this presented as Marc Galloway cyst that either popped or was I indeed. He was placed on antibiotics doxycycline. The patient thinks Marc Galloway culture that was done that was negative although there was Marc Galloway fair amount of drainage. Fortunately the area never really healed. He now has an open area roughly 1 cm in depth and 1.7 cm of undermining at roughly 12:00 Past medical history includes type 2 diabetes, alcohol use, tobacco abuse, left ankle wound, depression,  obstructive sleep apnea, hypertension, hyperlipidemia, left ankle Charcot deformity 3/28; abscess or cyst on the left upper chest. This is come in from an undermining area of 1.7-1 this is improved overall circumference seems better. We have been using silver alginate. No evidence of infection 4/4; abscess or cyst on the left upper chest that went underwent an IandD. I measured the undermining area last time at 1 cm although this is probably Marc Galloway bit optimistic as we got 1.7 today. The orifice is certainly smaller although this tunnels superiorly. There is no tenderness we have been using silver alginate 4/11; Patient had Marc Galloway culture done at last clinic visit that showed few E. coli and staph. He was started on Augmentin and will continue this for 10 days. He continues to use silver alginate every other day however reports issues with keeping the dressing in place due to hair on his chest and small wound opening.He denies any purulent drainage, increased warmth or tenderness to the area. 4/19; patient presents for 1 week follow-up. He has been using iodoform packing to the wound with no issues. He overall feels well. He denies any purulent drainage increased warmth or tenderness to the area. Patient states he feels nauseous on his current antibiotic regiment and has taken about 4 days worth. He does not want to take it anymore. 5/3; patient presents for 2-week follow-up. He has been using iodoform packing but stopped about 1 week ago because he says it closed over. He overall feels well today and has no complaints. He denies any signs of infection. 5/17; patient presents for 2-week follow-up. He has been using iodoform packing but was able to stop last week because it had closed in. He feels well and has no complaints today. He denies signs of infection. Electronic Signature(s) Signed: 07/31/2020 2:26:20 PM By: Marc Galloway Corwin DO Entered By: Marc Galloway Galloway on 07/31/2020  14:00:20 -------------------------------------------------------------------------------- Physical  Exam Details Patient Name: Date of Service: Marc Galloway Galloway, Marc Galloway Marc Galloway E. 07/31/2020 1:00 PM Medical Record Number: [id number] Patient Account Number: [id number] Date of Birth/Sex: Treating RN: [birth year] (45 y.o. Marc Galloway Galloway Primary Care Provider: Mila Galloway Other Clinician: Referring Provider: Treating Provider/Extender: Marc Galloway Galloway Weeks in Treatment: 8 Constitutional respirations regular, non-labored and within target range for patient.[name] Psychiatric pleasant and cooperative. Notes Chest: Previous wound site is epithelialized. There is no induration or tenderness to palpation. There is no erythema or increased warmth to the wound site. There is no drainage. Electronic Signature(s) Signed: 07/31/2020 2:26:20 PM By: Marc Galloway Corwin DO Entered By: Marc Galloway Galloway on 07/31/2020 14:23:29 -------------------------------------------------------------------------------- Physician Orders Details Patient Name: Date of Service: Marc Galloway Galloway, Marc Galloway Marc Galloway E. 07/31/2020 1:00 PM Medical Record Number: [id number] Patient Account Number: [id number] Date of Birth/Sex: Treating RN: 09-21-1975 (45 y.o. Marc Galloway Galloway Primary Care Provider: Mila Galloway Other Clinician: Referring Provider: Treating Provider/Extender: Marc Galloway Galloway in Treatment: 8 Verbal / Phone Orders: No Diagnosis Coding ICD-10 Coding Code Description L98.498 Non-pressure chronic ulcer of skin of other sites with other specified severity L72.3 Sebaceous cyst Discharge From [facility] Services Discharge from Wound Care Center - Wound healed!! Electronic Signature(s) Signed: 07/31/2020 2:26:20 PM By: Marc Galloway Corwin DO Entered By: Marc Galloway Galloway on 07/31/2020 14:23:42 -------------------------------------------------------------------------------- Problem List Details Patient Name: Date of  Service: Marc Galloway Galloway, Marc Galloway Marc Galloway E. 07/31/2020 1:00 PM Medical Record Number: [id number] Patient Account Number: [id number] Date of Birth/Sex: Treating RN: [birth year] (45 y.o. Marc Galloway Galloway Primary Care Provider: Mila Galloway Other Clinician: Referring Provider: Treating Provider/Extender: Marc Galloway Galloway in Treatment: 8 Active Problems ICD-10 Encounter Code Description Active Date MDM Diagnosis L98.498 Non-pressure chronic ulcer of skin of other sites with other specified severity 06/04/2020 No Yes L72.3 Sebaceous cyst 06/04/2020 No Yes Inactive Problems Resolved Problems Electronic Signature(s) Signed: 07/31/2020 2:26:20 PM By: Marc Galloway Corwin DO Entered By: Marc Galloway Galloway on 07/31/2020 13:59:11 -------------------------------------------------------------------------------- Progress Note Details Patient Name: Date of Service: Marc Galloway Galloway, Marc Galloway Marc Galloway E. 07/31/2020 1:00 PM Medical Record Number: [id number] Patient Account Number: [id number] Date of Birth/Sex: Treating RN: 09/29/1975 (45 y.o. Marc Galloway Galloway, Marc Galloway Galloway Primary Care Provider: Mila Galloway Other Clinician: Referring Provider: Treating Provider/Extender: Marc Galloway Galloway in Treatment: 8 Subjective Chief Complaint Information obtained from Patient 06/04/2020; this is Marc Galloway patient with Marc Galloway open wound on the left upper anterior chest History of Present Illness (HPI) ADMISSION 06/04/2020 This is Marc Galloway 45 year old man who is Marc Galloway type II diabetic. Apparently in January noticed some itching on his anterior upper chest scratched incessantly and then opened the wound. According to his primary doctor's notes from 04/20/2020 this presented as Marc Galloway cyst that either popped or was I indeed. He was placed on antibiotics doxycycline. The patient thinks Marc Galloway culture that was done that was negative although there was Marc Galloway fair amount of drainage. Fortunately the area never really healed. He now has an open area roughly 1 cm in  depth and 1.7 cm of undermining at roughly 12:00 Past medical history includes type 2 diabetes, alcohol use, tobacco abuse, left ankle wound, depression, obstructive sleep apnea, hypertension, hyperlipidemia, left ankle Charcot deformity 3/28; abscess or cyst on the left upper chest. This is come in from an undermining area of 1.7-1 this is improved overall circumference seems better. We have been using silver alginate. No evidence of infection 4/4; abscess or cyst on the left upper chest that went underwent an IandD. I measured the undermining area last time at 1 cm although this  is probably Marc Galloway bit optimistic as we got 1.7 today. The orifice is certainly smaller although this tunnels superiorly. There is no tenderness we have been using silver alginate 4/11; Patient had Marc Galloway culture done at last clinic visit that showed few E. coli and staph. He was started on Augmentin and will continue this for 10 days. He continues to use silver alginate every other day however reports issues with keeping the dressing in place due to hair on his chest and small wound opening.He denies any purulent drainage, increased warmth or tenderness to the area. 4/19; patient presents for 1 week follow-up. He has been using iodoform packing to the wound with no issues. He overall feels well. He denies any purulent drainage increased warmth or tenderness to the area. Patient states he feels nauseous on his current antibiotic regiment and has taken about 4 days worth. He does not want to take it anymore. 5/3; patient presents for 2-week follow-up. He has been using iodoform packing but stopped about 1 week ago because he says it closed over. He overall feels well today and has no complaints. He denies any signs of infection. 5/17; patient presents for 2-week follow-up. He has been using iodoform packing but was able to stop last week because it had closed in. He feels well and has no complaints today. He denies signs of  infection. Patient History Information obtained from Patient. Family History Cancer - Mother, Heart Disease - Mother, Hypertension - Mother, No family history of Diabetes, Hereditary Spherocytosis, Kidney Disease, Lung Disease, Seizures, Stroke, Thyroid Problems, Tuberculosis. Social History Current every day smoker - 1/2 ppd, Marital Status - Single, Alcohol Use - Daily - beer, Drug Use - No History, Caffeine Use - Daily - coffee, tea, soda. Medical History Eyes Denies history of Cataracts, Glaucoma, Optic Neuritis Ear/Nose/Mouth/Throat Denies history of Chronic sinus problems/congestion, Middle ear problems Respiratory Patient has history of Sleep Apnea Cardiovascular Patient has history of Congestive Heart Failure, Hypertension Endocrine Patient has history of Type II Diabetes Denies history of Type I Diabetes Genitourinary Denies history of End Stage Renal Disease Integumentary (Skin) Denies history of History of Burn Musculoskeletal Patient has history of Osteoarthritis Denies history of Gout, Rheumatoid Arthritis, Osteomyelitis Neurologic Patient has history of Neuropathy Denies history of Dementia, Quadriplegia, Paraplegia, Seizure Disorder Oncologic Denies history of Received Chemotherapy, Received Radiation Psychiatric Denies history of Anorexia/bulimia, Confinement Anxiety Hospitalization/Surgery History - tonsillectomy. - tubes in ears. - nasal reconstruction. Medical Marc Galloway Surgical History Notes nd Constitutional Symptoms (General Health) morbid obesity Cardiovascular hyperlipidemia Musculoskeletal charcot left ankle Psychiatric depression Objective Constitutional respirations regular, non-labored and within target range for patient.. Vitals Time Taken: 1:45 PM, Height: 65 in, Weight: 256 lbs, BMI: 42.6, Temperature: 98.2 F, Pulse: 86 bpm, Respiratory Rate: 18 breaths/min, Blood Pressure: 158/97 mmHg, Capillary Blood Glucose: 260  mg/dl. Psychiatric pleasant and cooperative. General Notes: Chest: Previous wound site is epithelialized. There is no induration or tenderness to palpation. There is no erythema or increased warmth to the wound site. There is no drainage. Integumentary (Hair, Skin) Wound #1 status is Healed - Epithelialized. Original cause of wound was Bump. The date acquired was: 03/26/2020. The wound has been in treatment 8 weeks. The wound is located on the Left Chest. The wound measures 0cm length x 0cm width x 0cm depth; 0cm^2 area and 0cm^3 volume. There is no tunneling or undermining noted. There is Marc Galloway none present amount of drainage noted. The wound margin is well defined and not attached to the wound base.  There is no granulation within the wound bed. There is no necrotic tissue within the wound bed. Assessment Active Problems ICD-10 Non-pressure chronic ulcer of skin of other sites with other specified severity Sebaceous cyst Previous wound site is epithelialized. When I tried to probe nothing opened. No signs of infection. I told the patient that if this were to open up for any reason we would be happy to see him again but he can follow-up as needed. Plan Discharge From [facility] Services: Discharge from Wound Care Center - Wound healed!! 1. Discharge from the clinic 2. Follow-up as needed Electronic Signature(s) Signed: 07/31/2020 2:26:20 PM By: Marc Galloway Corwin DO Entered By: Marc Galloway Galloway on 07/31/2020 14:25:21 -------------------------------------------------------------------------------- HxROS Details Patient Name: Date of Service: Marc Galloway Galloway, Marc Galloway Marc Galloway E. 07/31/2020 1:00 PM Medical Record Number: [id number] Patient Account Number: [id number] Date of Birth/Sex: Treating RN: [birth year] (45 y.o. Marc Galloway Galloway, Marc Galloway Galloway Primary Care Provider: Mila Galloway Other Clinician: Referring Provider: Treating Provider/Extender: Marc Galloway Galloway in Treatment: 8 Information Obtained  From Patient Constitutional Symptoms (General Health) Medical History: Past Medical History Notes: morbid obesity Eyes Medical History: Negative for: Cataracts; Glaucoma; Optic Neuritis Ear/Nose/Mouth/Throat Medical History: Negative for: Chronic sinus problems/congestion; Middle ear problems Respiratory Medical History: Positive for: Sleep Apnea Cardiovascular Medical History: Positive for: Congestive Heart Failure; Hypertension Past Medical History Notes: hyperlipidemia Endocrine Medical History: Positive for: Type II Diabetes Negative for: Type I Diabetes Time with diabetes: since 2016 Treated with: Insulin, Oral agents Blood sugar tested every day: No Genitourinary Medical History: Negative for: End Stage Renal Disease Integumentary (Skin) Medical History: Negative for: History of Burn Musculoskeletal Medical History: Positive for: Osteoarthritis Negative for: Gout; Rheumatoid Arthritis; Osteomyelitis Past Medical History Notes: charcot left ankle Neurologic Medical History: Positive for: Neuropathy Negative for: Dementia; Quadriplegia; Paraplegia; Seizure Disorder Oncologic Medical History: Negative for: Received Chemotherapy; Received Radiation Psychiatric Medical History: Negative for: Anorexia/bulimia; Confinement Anxiety Past Medical History Notes: depression Immunizations Pneumococcal Vaccine: Received Pneumococcal Vaccination: No Implantable Devices None Hospitalization / Surgery History Type of Hospitalization/Surgery tonsillectomy tubes in ears nasal reconstruction Family and Social History Cancer: Yes - Mother; Diabetes: No; Heart Disease: Yes - Mother; Hereditary Spherocytosis: No; Hypertension: Yes - Mother; Kidney Disease: No; Lung Disease: No; Seizures: No; Stroke: No; Thyroid Problems: No; Tuberculosis: No; Current every day smoker - 1/2 ppd; Marital Status - Single; Alcohol Use: Daily - beer; Drug Use: No History; Caffeine Use: Daily  - coffee, tea, soda; Financial Concerns: No; Food, Clothing or Shelter Needs: No; Support System Lacking: No; Transportation Concerns: No Electronic Signature(s) Signed: 07/31/2020 2:26:20 PM By: Marc Galloway Corwin DO Signed: 07/31/2020 5:09:03 PM By: Fonnie Mu RN Entered By: Marc Galloway Galloway on 07/31/2020 14:00:28 -------------------------------------------------------------------------------- SuperBill Details Patient Name: Date of Service: Marc Galloway Galloway, Marc Galloway Marc Galloway E. 07/31/2020 Medical Record Number: [id number] Patient Account Number: [id number] Date of Birth/Sex: Treating RN: 1975/03/27 (45 y.o. Marc Galloway Galloway Primary Care Provider: Mila Galloway Other Clinician: Referring Provider: Treating Provider/Extender: Marc Galloway Galloway in Treatment: 8 Diagnosis Coding ICD-10 Codes Code Description [phone] Non-pressure chronic ulcer of skin of other sites with other specified severity L72.3 Sebaceous cyst Facility Procedures CPT4 Code: [id number] Description: [id number] - WOUND CARE VISIT-LEV 2 EST PT Modifier: Quantity: 1 Physician Procedures : CPT4 Code Description Modifier [id number] - WC PHYS LEVEL 3 - EST PT ICD-10 Diagnosis Description L98.498 Non-pressure chronic ulcer of skin of other sites with other specified severity L72.3 Sebaceous cyst Quantity: 1 Electronic Signature(s) Signed: 07/31/2020 2:26:20 PM By: Marc Galloway Corwin DO Entered By: Mikey Bussing,  Annitta Fifield on 07/31/2020 14:25:36

## 2020-08-01 NOTE — Progress Notes (Signed)
JANOS, SHAMPINE ([id number]) Visit Report for 07/31/2020 Arrival Information Details Patient Name: Date of Service: Marc Galloway, Marc [name] 07/31/2020 1:00 PM Medical Record Number: [id number] Patient Account Number: [id number] Date of Birth/Sex: Treating RN: 1975/06/19 (45 y.o. [name], Lauren Primary Care Rose Hippler: [name] Other Clinician: Referring Arah Aro: Treating Curley Fayette/Extender: Burman Riis in Treatment: 8 Visit Information History Since Last Visit Added or deleted any medications: No Patient Arrived: Marc Galloway Any new allergies or adverse reactions: No Arrival Time: 13:43 Had Marc fall or experienced change in No Accompanied By: self activities of daily living that may affect Transfer Assistance: None risk of falls: Patient Identification Verified: Yes Signs or symptoms of abuse/neglect since last visito No Secondary Verification Process Completed: Yes Hospitalized since last visit: No Patient Requires Transmission-Based Precautions: No Implantable device outside of the clinic excluding No Patient Has Alerts: No cellular tissue based products placed in the center since last visit: Has Dressing in Place as Prescribed: Yes Pain Present Now: No Electronic Signature(s) Signed: 07/31/2020 3:36:19 PM By: Karl Ito Entered By: Karl Ito on 07/31/2020 13:44:26 -------------------------------------------------------------------------------- Clinic Level of Care Assessment Details Patient Name: Date of Service: Marc Galloway, Marc [name] 07/31/2020 1:00 PM Medical Record Number: [id number] Patient Account Number: [id number] Date of Birth/Sex: Treating RN: 1975-12-13 (45 y.o. [name] Primary Care Hollis Tuller: [name] Other Clinician: Referring Lindley Hiney: Treating Madelena Maturin/Extender: Burman Riis in Treatment: 8 Clinic Level of Care Assessment Items TOOL 4 Quantity Score X- 1 0 Use when only an EandM is  performed on FOLLOW-UP visit ASSESSMENTS - Nursing Assessment / Reassessment X- 1 10 Reassessment of Co-morbidities (includes updates in patient status) X- 1 5 Reassessment of Adherence to Treatment Plan ASSESSMENTS - Wound and Skin Marc ssessment / Reassessment X - Simple Wound Assessment / Reassessment - one wound 1 5 []  - 0 Complex Wound Assessment / Reassessment - multiple wounds []  - 0 Dermatologic / Skin Assessment (not related to wound area) ASSESSMENTS - Focused Assessment []  - 0 Circumferential Edema Measurements - multi extremities []  - 0 Nutritional Assessment / Counseling / Intervention []  - 0 Lower Extremity Assessment (monofilament, tuning fork, pulses) []  - 0 Peripheral Arterial Disease Assessment (using hand held doppler) ASSESSMENTS - Ostomy and/or Continence Assessment and Care []  - 0 Incontinence Assessment and Management []  - 0 Ostomy Care Assessment and Management (repouching, etc.) PROCESS - Coordination of Care X - Simple Patient / Family Education for ongoing care 1 15 []  - 0 Complex (extensive) Patient / Family Education for ongoing care X- 1 10 Staff obtains , Records, T Results / Process Orders est []  - 0 Staff telephones HHA, Nursing Homes / Clarify orders / etc []  - 0 Routine Transfer to another Facility (non-emergent condition) []  - 0 Routine Hospital Admission (non-emergent condition) []  - 0 New Admissions / / Ordering NPWT Apligraf, etc. , []  - 0 Emergency Hospital Admission (emergent condition) X- 1 10 Simple Discharge Coordination []  - 0 Complex (extensive) Discharge Coordination PROCESS - Special Needs []  - 0 Pediatric / Minor Patient Management []  - 0 Isolation Patient Management []  - 0 Hearing / Language / Visual special needs []  - 0 Assessment of Community assistance (transportation, D/C planning, etc.) []  - 0 Additional assistance / Altered mentation []  - 0 Support Surface(s) Assessment  (bed, cushion, seat, etc.) INTERVENTIONS - Wound Cleansing / Measurement X - Simple Wound Cleansing - one wound 1 5 []  - 0 Complex Wound Cleansing - multiple wounds X- 1  5 Wound Imaging (photographs - any number of wounds) []  - 0 Wound Tracing (instead of photographs) X- 1 5 Simple Wound Measurement - one wound []  - 0 Complex Wound Measurement - multiple wounds INTERVENTIONS - Wound Dressings []  - 0 Small Wound Dressing one or multiple wounds []  - 0 Medium Wound Dressing one or multiple wounds []  - 0 Large Wound Dressing one or multiple wounds []  - 0 Application of Medications - topical []  - 0 Application of Medications - injection INTERVENTIONS - Miscellaneous []  - 0 External ear exam []  - 0 Specimen Collection (cultures, biopsies, blood, body fluids, etc.) []  - 0 Specimen(s) / Culture(s) sent or taken to Lab for analysis []  - 0 Patient Transfer (multiple staff / / Similar devices) []  - 0 Simple Staple / Suture removal (25 or less) []  - 0 Complex Staple / Suture removal (26 or more) []  - 0 Hypo / Hyperglycemic Management (close monitor of Blood Glucose) []  - 0 Ankle / Brachial Index (ABI) - do not check if billed separately X- 1 5 Vital Signs Has the patient been seen at the hospital within the last three years: Yes Total Score: 75 Level Of Care: New/Established - Level 2 Electronic Signature(s) Signed: 08/01/2020 6:30:22 PM By: RN, BSN Entered By: on 07/31/2020 13:57:59 -------------------------------------------------------------------------------- Encounter Discharge Information Details Patient Name: Date of Service: Marc Galloway, Marc [name] 07/31/2020 1:00 PM Medical Record Number: Patient Account Number: Date of Birth/Sex: Treating RN: November 02, 1975 (45 y.o. Primary Care Seferina Brokaw: Nurse, adult Other Clinician: Referring Antonia Jicha: Treating Mertis Mosher/Extender: in Treatment: 8 Encounter Discharge Information Items Discharge Condition: Stable Ambulatory Status: Cane Discharge Destination: Home Transportation: Private Auto Accompanied By: alone Schedule Follow-up Appointment: Yes Clinical Summary of Care: Patient Declined Electronic Signature(s) Signed: 08/01/2020 6:30:22 PM By: RN, BSN Entered By: on 07/31/2020 13:59:51 -------------------------------------------------------------------------------- Lower Extremity Assessment Details Patient Name: Date of Service: Marc Galloway, Marc [name] 07/31/2020 1:00 PM Medical Record Number: Zandra Abts Patient Account Number: 08/02/2020 Date of Birth/Sex: Treating RN: Jun 23, 1975 (45 y.o. [id number], Lauren Primary Care Anik Wesch: 08/31/1975 Other Clinician: Referring Ester Hilley: Treating Reg Bircher/Extender: 49 Weeks in Treatment: 8 Electronic Signature(s) Signed: 07/31/2020 3:36:19 PM By: [name] Signed: 07/31/2020 5:09:03 PM By: 08/03/2020 RN Entered By: Zandra Abts on 07/31/2020 13:46:02 -------------------------------------------------------------------------------- Multi Wound Chart Details Patient Name: Date of Service: Marc Galloway, Marc [name] 07/31/2020 1:00 PM Medical Record Number: 08/02/2020 Patient Account Number: [id number] Date of Birth/Sex: Treating RN: January 08, 1976 (45 y.o. 47, Lauren Primary Care Angel Weedon: [name] Other Clinician: Referring Kerrie Latour: Treating Hermelinda Diegel/Extender: [name] in Treatment: 8 Vital Signs Height(in): 65 Capillary Blood Glucose(mg/dl): Marc Galloway Weight(lbs): 08/02/2020 Pulse(bpm): 86 Body Mass Index(BMI): 43 Blood Pressure(mmHg): 158/97 Temperature(F): 98.2 Respiratory Rate(breaths/min): 18 Photos: [1:No Photos Left Chest] [N/Marc:N/Marc N/Marc] Wound Location: [1:Bump] [N/Marc:N/Marc] Wounding Event: [1:Abscess] [N/Marc:N/Marc] Primary Etiology: [1:Sleep  Apnea, Congestive Heart] [N/Marc:N/Marc] Comorbid History: [1:Failure, Hypertension, Type II Diabetes, Osteoarthritis, Neuropathy 03/26/2020] [N/Marc:N/Marc] Date Acquired: [1:8] [N/Marc:N/Marc] Weeks of Treatment: [1:Healed - Epithelialized] [N/Marc:N/Marc] Wound Status: [1:0x0x0] [N/Marc:N/Marc] Measurements L x W x D (cm) [1:0] [N/Marc:N/Marc] Marc (cm) : rea [1:0] [N/Marc:N/Marc] Volume (cm) : [1:100.00%] [N/Marc:N/Marc] % Reduction in Area: [1:100.00%] [N/Marc:N/Marc] % Reduction in Volume: [1:Full Thickness Without Exposed] [N/Marc:N/Marc] Classification: [1:Support Structures None Present] [N/Marc:N/Marc] Exudate Amount: [1:Well defined, not attached] [N/Marc:N/Marc] Wound Margin: [1:None Present (0%)] [N/Marc:N/Marc] Granulation Amount: [1:None Present (0%)] [N/Marc:N/Marc] Necrotic Amount: [1:Fascia:  No] [N/Marc:N/Marc] Exposed Structures: [1:Fat Layer (Subcutaneous Tissue): No Tendon: No Muscle: No Joint: No Bone: No Large (67-100%)] [N/Marc:N/Marc] Treatment Notes Electronic Signature(s) Signed: 07/31/2020 2:26:20 PM By: Geralyn Corwin DO Signed: 07/31/2020 5:09:03 PM By: Fonnie Mu RN Entered By: Geralyn Corwin on 07/31/2020 13:59:18 -------------------------------------------------------------------------------- Multi-Disciplinary Care Plan Details Patient Name: Date of Service: Marc Galloway, Marc [name] 07/31/2020 1:00 PM Medical Record Number: [id number] Patient Account Number: [id number] Date of Birth/Sex: Treating RN: August 22, 1975 (45 y.o. [name] Primary Care Roisin Mones: [name] Other Clinician: Referring Manar Smalling: Treating Dov Dill/Extender: Burman Riis in Treatment: 8 Multidisciplinary Care Plan reviewed with physician Active Inactive Electronic Signature(s) Signed: 08/01/2020 6:30:22 PM By: Zandra Abts RN, BSN Entered By: Zandra Abts on 07/31/2020 13:57:10 -------------------------------------------------------------------------------- Pain Assessment Details Patient Name: Date of Service: Marc Galloway, Marc NDREW  E. 07/31/2020 1:00 PM Medical Record Number: [id number] Patient Account Number: [id number] Date of Birth/Sex: Treating RN: 1975-08-31 (45 y.o. [name], Lauren Primary Care Iver Miklas: [name] Other Clinician: Referring Rheana Casebolt: Treating Khani Paino/Extender: Burman Riis in Treatment: 8 Active Problems Location of Pain Severity and Description of Pain Patient Has Paino No Site Locations Pain Management and Medication Current Pain Management: Electronic Signature(s) Signed: 07/31/2020 3:36:19 PM By: Karl Ito Signed: 07/31/2020 5:09:03 PM By: Fonnie Mu RN Entered By: Karl Ito on 07/31/2020 13:45:56 -------------------------------------------------------------------------------- Patient/Caregiver Education Details Patient Name: Date of Service: Marc Pro [name] 5/17/2022andnbsp1:00 PM Medical Record Number: [id number] Patient Account Number: [id number] Date of Birth/Gender: Treating RN: Nov 03, 1975 (45 y.o. [name] Primary Care Physician: [name] Other Clinician: Referring Physician: Treating Physician/Extender: Burman Riis in Treatment: 8 Education Assessment Education Provided To: Patient Education Topics Provided Wound/Skin Impairment: Methods: Explain/Verbal Responses: State content correctly Electronic Signature(s) Signed: 08/01/2020 6:30:22 PM By: Zandra Abts RN, BSN Entered By: Zandra Abts on 07/31/2020 13:57:20 -------------------------------------------------------------------------------- Wound Assessment Details Patient Name: Date of Service: Marc Galloway, Marc [name] 07/31/2020 1:00 PM Medical Record Number: [id number] Patient Account Number: [id number] Date of Birth/Sex: Treating RN: 12-01-1975 (45 y.o. [name], Lauren Primary Care Maryori Weide: [name] Other Clinician: Referring Alliana Mcauliff: Treating Leialoha Hanna/Extender: Marc Galloway Weeks in Treatment: 8 Wound Status Wound Number: 1 Primary Abscess Etiology: Wound Location: Left Chest Wound Healed - Epithelialized Wounding Event: Bump Status: Date Acquired: 03/26/2020 Comorbid Sleep Apnea, Congestive Heart Failure, Hypertension, Type II Weeks Of Treatment: 8 History: Diabetes, Osteoarthritis, Neuropathy Clustered Wound: No Photos Wound Measurements Length: (cm) Width: (cm) Depth: (cm) Area: (cm) Volume: (cm) 0 % Reduction in Area: 100% 0 % Reduction in Volume: 100% 0 Epithelialization: Large (67-100%) 0 Tunneling: No 0 Undermining: No Wound Description Classification: Full Thickness Without Exposed Support Structures Wound Margin: Well defined, not attached Exudate Amount: None Present Foul Odor After Cleansing: No Slough/Fibrino No Wound Bed Granulation Amount: None Present (0%) Exposed Structure Necrotic Amount: None Present (0%) Fascia Exposed: No Fat Layer (Subcutaneous Tissue) Exposed: No Tendon Exposed: No Muscle Exposed: No Joint Exposed: No Bone Exposed: No Electronic Signature(s) Signed: 07/31/2020 5:09:03 PM By: Fonnie Mu RN Signed: 08/01/2020 11:07:31 AM By: Karl Ito Entered By: Karl Ito on 07/31/2020 16:23:17 -------------------------------------------------------------------------------- Vitals Details Patient Name: Date of Service: [place], Marc [name] 07/31/2020 1:00 PM Medical Record Number: [id number] Patient Account Number: [id number] Date of Birth/Sex: Treating RN: Jan 10, 1976 (45 y.o. [name] Primary Care Mittie Knittel: [name] Other Clinician: Referring Merve Hotard: Treating Arryana Tolleson/Extender: Marc Galloway Weeks in Treatment: 8 Vital Signs Time Taken: 13:45 Temperature (F): 98.2 Height (in): 65  Pulse (bpm): 86 Weight (lbs): 256 Respiratory Rate (breaths/min): 18 Body Mass Index (BMI): 42.6 Blood Pressure (mmHg): 158/97 Capillary Blood Glucose (mg/dl):  119260 Reference Range: 80 - 120 mg / dl Electronic Signature(s) Signed: 07/31/2020 3:36:19 PM By: Karl Itoawkins, Destiny Entered By: Karl Itoawkins, Destiny on 07/31/2020 13:45:51

## 2020-09-10 DIAGNOSIS — I110 Hypertensive heart disease with heart failure: Secondary | ICD-10-CM | POA: Diagnosis not present

## 2020-09-10 DIAGNOSIS — E1165 Type 2 diabetes mellitus with hyperglycemia: Secondary | ICD-10-CM | POA: Diagnosis not present

## 2020-09-10 DIAGNOSIS — I509 Heart failure, unspecified: Secondary | ICD-10-CM | POA: Diagnosis not present

## 2020-09-10 DIAGNOSIS — E11610 Type 2 diabetes mellitus with diabetic neuropathic arthropathy: Secondary | ICD-10-CM | POA: Diagnosis not present

## 2020-09-10 DIAGNOSIS — E119 Type 2 diabetes mellitus without complications: Secondary | ICD-10-CM | POA: Diagnosis not present

## 2020-09-10 DIAGNOSIS — E1169 Type 2 diabetes mellitus with other specified complication: Secondary | ICD-10-CM | POA: Diagnosis not present

## 2020-09-10 DIAGNOSIS — I10 Essential (primary) hypertension: Secondary | ICD-10-CM | POA: Diagnosis not present

## 2020-09-10 DIAGNOSIS — E785 Hyperlipidemia, unspecified: Secondary | ICD-10-CM | POA: Diagnosis not present

## 2020-09-19 ENCOUNTER — Other Ambulatory Visit: Payer: Self-pay

## 2020-09-19 ENCOUNTER — Ambulatory Visit (INDEPENDENT_AMBULATORY_CARE_PROVIDER_SITE_OTHER): Payer: Medicare Other

## 2020-09-19 ENCOUNTER — Other Ambulatory Visit: Payer: Self-pay | Admitting: Podiatry

## 2020-09-19 ENCOUNTER — Ambulatory Visit (INDEPENDENT_AMBULATORY_CARE_PROVIDER_SITE_OTHER): Payer: Medicare Other | Admitting: Podiatry

## 2020-09-19 DIAGNOSIS — M79675 Pain in left toe(s): Secondary | ICD-10-CM

## 2020-09-19 DIAGNOSIS — M19072 Primary osteoarthritis, left ankle and foot: Secondary | ICD-10-CM

## 2020-09-19 DIAGNOSIS — M2142 Flat foot [pes planus] (acquired), left foot: Secondary | ICD-10-CM

## 2020-09-19 DIAGNOSIS — B351 Tinea unguium: Secondary | ICD-10-CM

## 2020-09-19 DIAGNOSIS — M659 Synovitis and tenosynovitis, unspecified: Secondary | ICD-10-CM

## 2020-09-19 DIAGNOSIS — M79674 Pain in right toe(s): Secondary | ICD-10-CM | POA: Diagnosis not present

## 2020-09-19 DIAGNOSIS — M7672 Peroneal tendinitis, left leg: Secondary | ICD-10-CM

## 2020-09-19 NOTE — Progress Notes (Signed)
Subjective:  45 y.o. male presenting today for routine foot care.  Patient states that he is very frustrated because his ankle continues to have pain.  He is unable to walk with the assistance of a cane due to stability and has collapsed arch of his left foot.  We have tried multiple conservative treatment modalities over the past few years including steroid injections and immobilization and the use of an ankle brace without any improvement.  The patient would like to discuss different treatment options and be able to walk without a cane potentially in the future   Past Medical History:  Diagnosis Date   Acute respiratory failure (HCC)    Allergic rhinitis    CAP (community acquired pneumonia)    Cardiac disease    CHF (congestive heart failure) (HCC)    Depression    Diabetes mellitus without complication (HCC)    Disturbance, sleep    ETOH abuse    Hyperlipidemia    Hypertension    Leukocytosis    Obesity    OSA (obstructive sleep apnea)    Sepsis (HCC)    Smoker    Tobacco abuse        Objective/Physical Exam General: The patient is alert and oriented x3 in no acute distress.  Dermatology: Skin is warm, dry and supple bilateral lower extremities. Negative for open lesions or macerations.  Hyperkeratotic elongated nails noted 1-5 bilateral  Vascular: Palpable pedal pulses bilaterally. No edema or erythema noted. Capillary refill within normal limits.  Neurological: Epicritic and protective threshold grossly intact bilaterally.   Musculoskeletal Exam: Range of motion within normal limits to all pedal and ankle joints bilateral. Muscle strength 5/5 in all groups bilateral.  Upon weightbearing there is a medial longitudinal arch collapse bilaterally. Remove foot valgus noted to the bilateral lower extremities with excessive pronation upon mid stance.  Radiographic Exam:  Normal osseous mineralization.  Moderate degenerative changes noted to the left foot and ankle.  No  fracture/dislocation/boney destruction.   Pes planus noted on radiographic exam lateral views. Decreased calcaneal inclination and metatarsal declination angle is noted. Anterior break in the cyma line noted on lateral views. Medial talar head to deviation noted on AP radiograph.   Assessment: 1.  Acquired pes planovalgus deformity left 2.  Posterior tibial tendon dysfunction left 3.  Pain due to onychomycosis of toenails both   Plan of Care:  1. Patient was evaluated. X-Rays reviewed.  2.  Today we discussed surgery versus additional conservative treatment modalities.  Patient is mostly exhausted conservative treatments including anti-inflammatories orally, steroid injections, immobilization in a cam boot and ankle bracing.  Patient walks with a cane and states that he would simply like to be able to walk and function without the use of a cane.  Today we did open up the discussion to perform surgery to the left foot and ankle.  I do believe the patient would benefit and the benefits far outweigh the risk. 3.  Prior to surgery, MRI ordered left foot and ankle  4.  Mechanical debridement of nails 1-5 bilateral performed using a nail nipper without incident or bleeding 5.  Return to clinic in 3 months for routine foot care and to discuss additional surgery and possible scheduling   [name], [name] [facility]  Dr. [name], [name]    [address]  [place], [place] [address]                Office [phone]  Fax [phone]

## 2020-10-01 ENCOUNTER — Ambulatory Visit
Admission: RE | Admit: 2020-10-01 | Discharge: 2020-10-01 | Disposition: A | Discharge status: Home / Self Care | Payer: Medicare Other | Source: Ambulatory Visit | Attending: Podiatry | Admitting: Podiatry

## 2020-10-01 DIAGNOSIS — R600 Localized edema: Secondary | ICD-10-CM | POA: Diagnosis not present

## 2020-10-01 DIAGNOSIS — M19072 Primary osteoarthritis, left ankle and foot: Secondary | ICD-10-CM | POA: Diagnosis not present

## 2020-10-01 DIAGNOSIS — M2142 Flat foot [pes planus] (acquired), left foot: Secondary | ICD-10-CM

## 2020-10-01 DIAGNOSIS — S9302XA Subluxation of left ankle joint, initial encounter: Secondary | ICD-10-CM | POA: Diagnosis not present

## 2020-10-01 DIAGNOSIS — S8262XA Displaced fracture of lateral malleolus of left fibula, initial encounter for closed fracture: Secondary | ICD-10-CM | POA: Diagnosis not present

## 2020-10-01 DIAGNOSIS — Q666 Other congenital valgus deformities of feet: Secondary | ICD-10-CM | POA: Diagnosis not present

## 2020-10-01 DIAGNOSIS — M7989 Other specified soft tissue disorders: Secondary | ICD-10-CM | POA: Diagnosis not present

## 2020-10-01 DIAGNOSIS — S93492A Sprain of other ligament of left ankle, initial encounter: Secondary | ICD-10-CM | POA: Diagnosis not present

## 2020-10-01 IMAGING — MR MR ANKLE*L* W/O CM
5 series · 36 of 40 positions shown · non-contrast
Comparison: Left ankle radiograph [DATE]

CLINICAL DATA: Ankle pain, chronic, osteoarthritis suspected

EXAM:
MRI OF THE LEFT ANKLE WITHOUT CONTRAST
TECHNIQUE: Multiplanar, multisequence MR imaging of the ankle was performed. No
intravenous contrast was administered.

[Series 4: T2 fat-sat · axial · 3.0mm · 0.50mm/px · z∈[-75,+39]mm · 9 of 32 slices shown (1 of 2)]
[im 1/32]
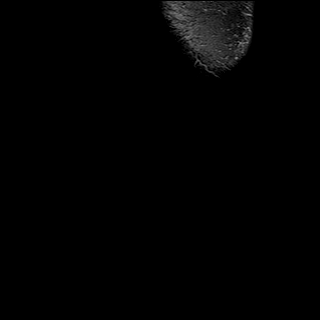
[im 4/32]
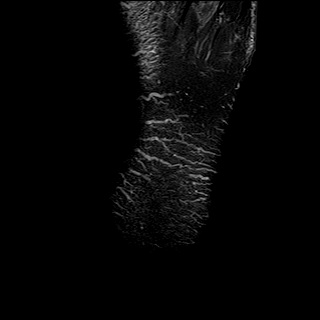
[im 8/32]
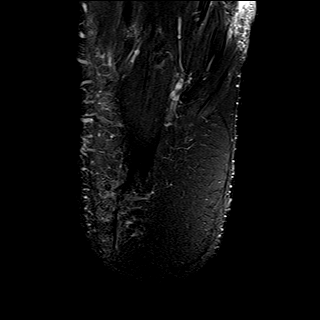
[im 12/32]
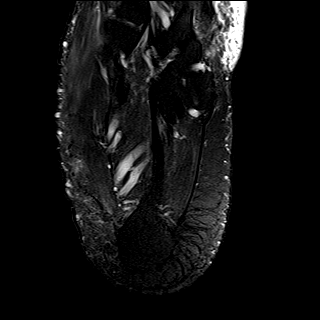
[im 16/32]
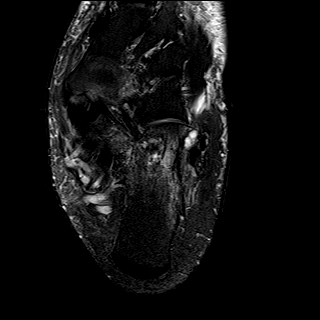
[im 20/32]
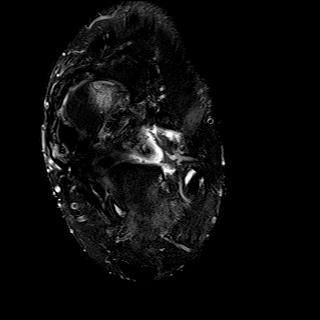
[im 24/32]
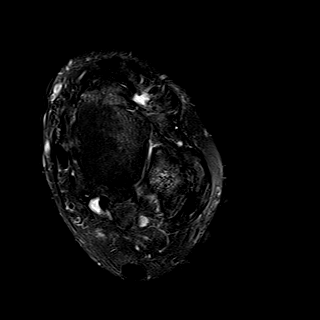
[im 28/32]
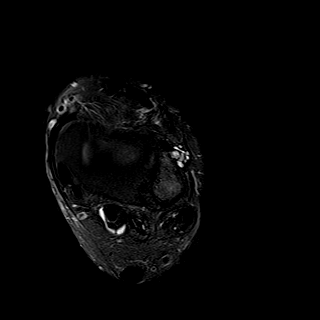
[im 32/32]
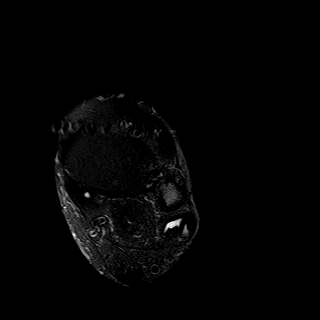

[Series 5: PD fat-sat · axial · 3.0mm · 0.42mm/px · z∈[-75,+39]mm · 9 of 32 slices shown]
[im 1/32]
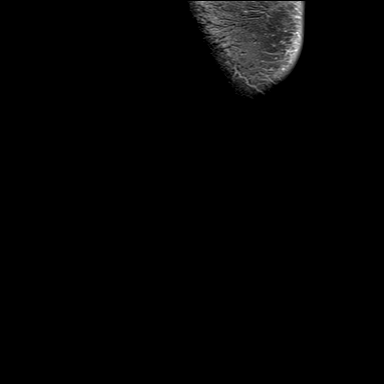
[im 4/32]
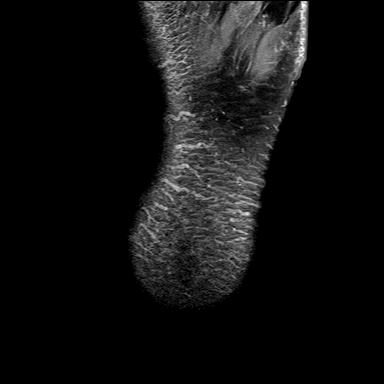
[im 8/32]
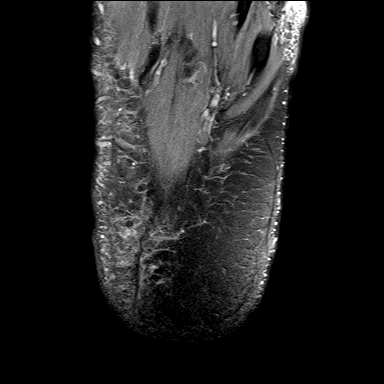
[im 12/32]
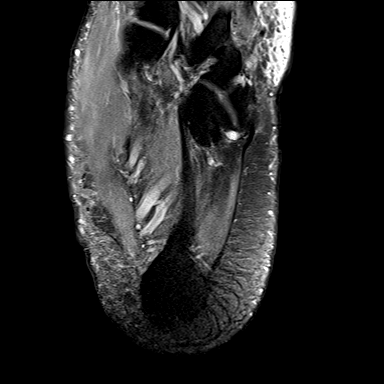
[im 16/32]
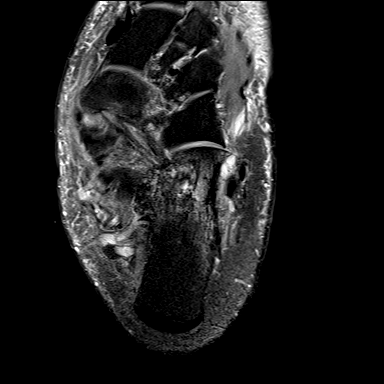
[im 20/32]
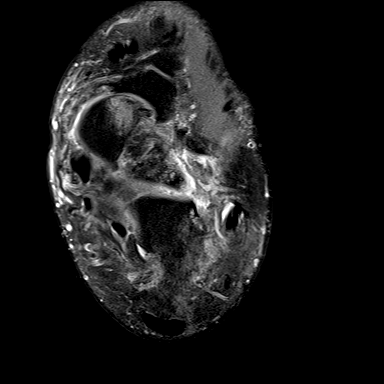
[im 24/32]
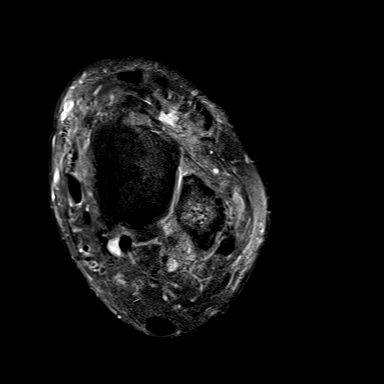
[im 28/32]
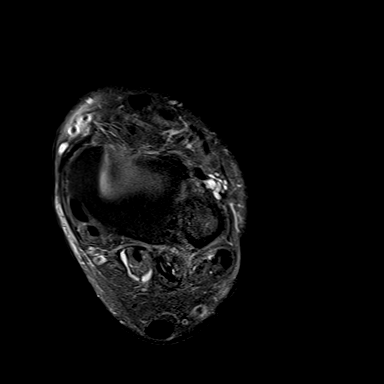
[im 32/32]
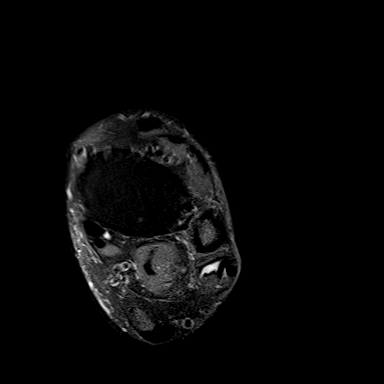

[Series 6: T1 · sagittal · 4.0mm · 0.56mm/px · 6 of 20 slices shown]
[im 1/20]
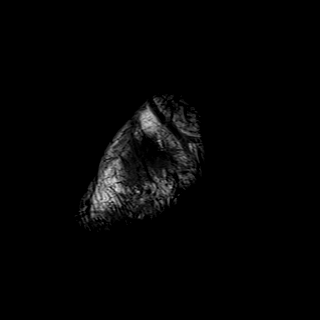
[im 4/20]
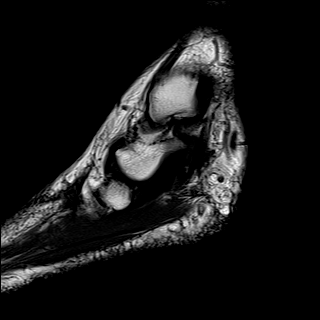
[im 8/20]
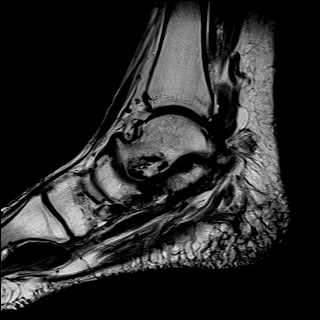
[im 12/20]
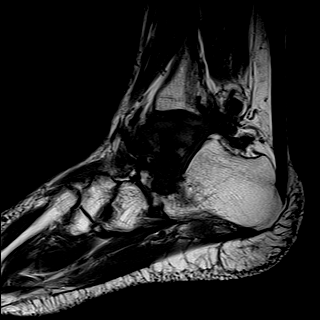
[im 16/20]
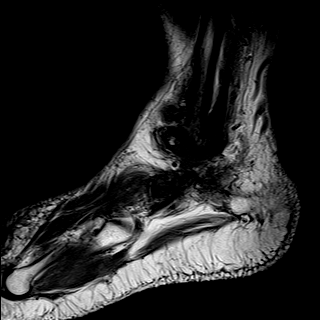
[im 20/20]
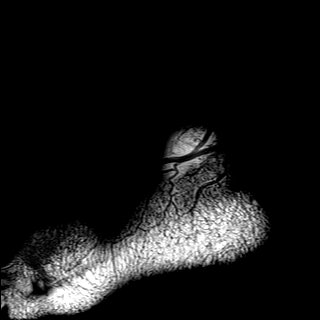

[Series 7: STIR · sagittal · 4.0mm · 0.35mm/px · 4 of 20 slices shown]
[im 1/20]
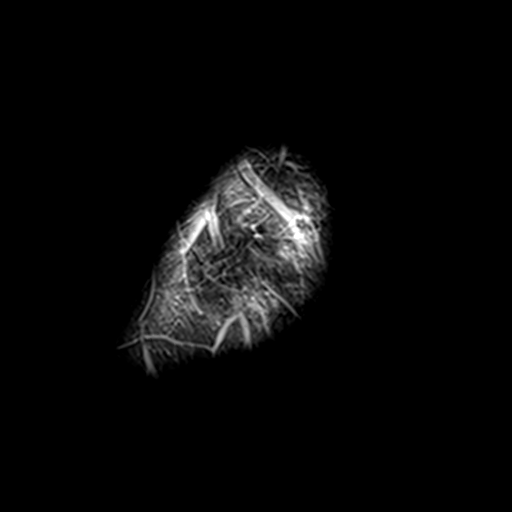
[im 4/20]
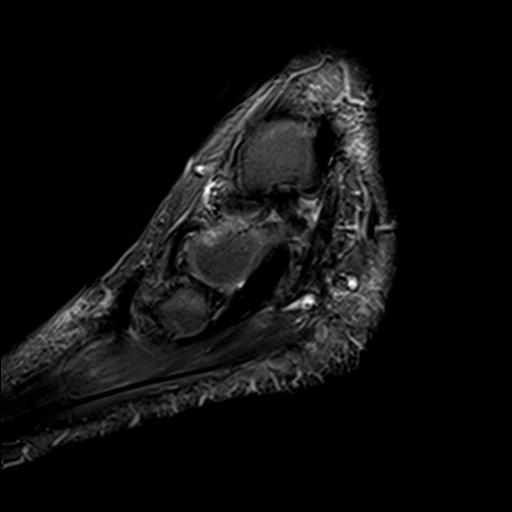
[im 8/20]
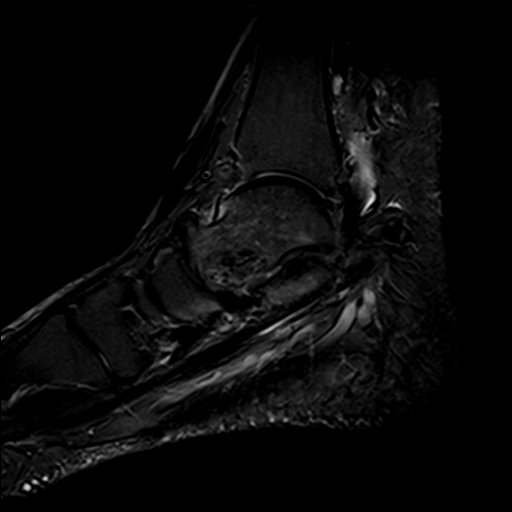
[im 12/20]
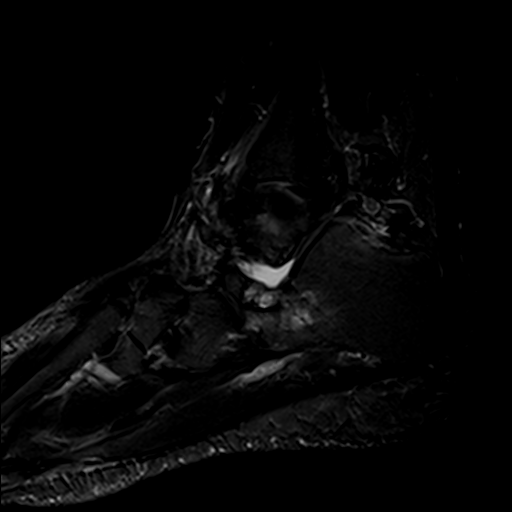

[Series 8: T2 fat-sat · coronal · 3.0mm · 0.50mm/px · 8 of 35 slices shown (2 of 2)]
[im 1/35]
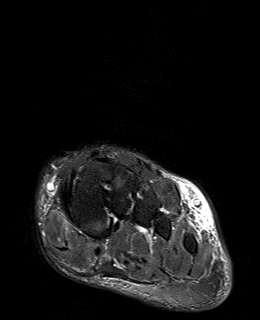
[im 4/35]
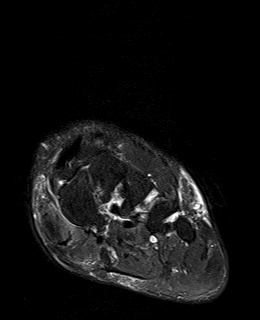
[im 12/35]
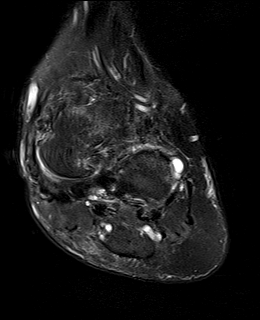
[im 16/35]
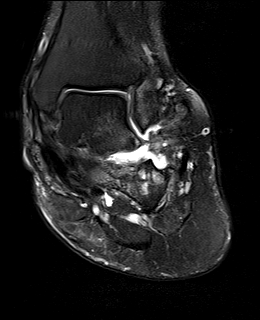
[im 19/35]
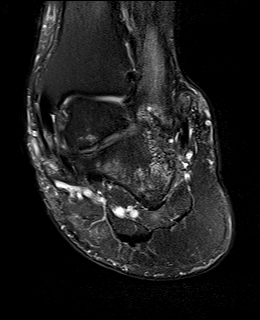
[im 23/35]
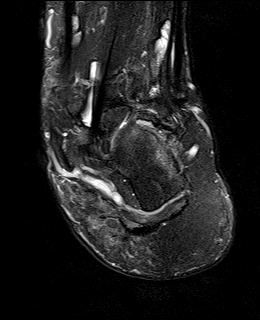
[im 31/35]
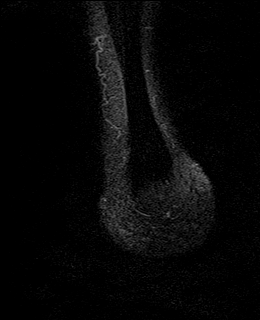
[im 35/35]
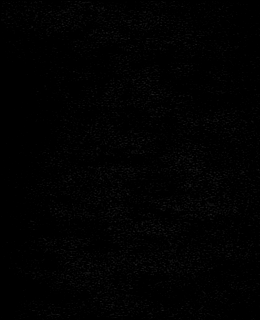

[36 of 40 positions shown; findings below may reference images not displayed]

FINDINGS: TENDONS

Peroneal: There is tenosynovitis of the peroneal longus and brevis
tendons above and below the lateral malleolus, which are subluxed
anterior laterally.

Posteromedial: Thickening of the posterior tibial tendon with
possible tiny interstitial tear. Flexor digitorum longus tendon
intact. Small amount of tenosynovial fluid along the flexor hallucis
longus, which is likely related to joint distension.

Anterior: Tibialis anterior tendon intact. Extensor hallucis longus
tendon intact Extensor digitorum longus tendon intact.

Achilles:  Intact.

Plantar Fascia: Intact.

LIGAMENTS

Lateral: The ATFL is thickened with laxity and non-visible proximal
attachment. Calcaneofibular ligament is likely intact. Posterior
talofibular ligament intact. Anterior and posterior tibiofibular
ligaments intact.

Medial: Deltoid ligament intact. Mild edema signal along the
superomedial portion of the spring ligament.

CARTILAGE

Ankle Joint: No joint effusion. Mild tibiotalar degenerative
arthritis.

Subtalar Joints/Sinus Tarsi: There is significant fluid and edema
within the sinus tarsi. Small subtalar joint effusion.

Bones: Pes planus with hindfoot valgus. There is extra-articular
subcortical bone marrow edema and cystic change at the lateral talar
process and adjacent calcaneus at the angle of Gissane as well as
the calcaneofibular region. This represents extra-articular
talocalcaneal and subfibular impingement. There is some bony
remodeling. There is talonavicular arthritis with periarticular bony
edema and evidence of small subchondral fracture in the talar head
(axial T2 image 13, sagittal T1 image 6).

Soft Tissue: No fluid collection or hematoma. Muscles are normal
without edema or atrophy. Tarsal tunnel is normal.
IMPRESSION: Pes planovalgus with findings of lateral hindfoot impingement as
described above.

Thickening of the posterior tibial tendon with possible tiny
interstitial tear. Mild edema along the superomedial portion of the
spring ligament consistent with a low-grade sprain.

Anterolateral subluxation of the peroneal tendons with tenosynovitis
above and below the lateral malleolus.

Chronic proximal ATFL tear.

Talonavicular arthritis with periarticular bony edema and evidence
of small subchondral fracture in the talar head.

## 2020-10-01 IMAGING — MR MR FOOT*L* W/O CM
4 of 5 series · 18 of 40 positions shown · non-contrast
Comparison: Left foot radiograph [DATE]

CLINICAL DATA: Foot pain, chronic, osteoarthritis suspected

EXAM:
MRI OF THE LEFT FOOT WITHOUT CONTRAST
TECHNIQUE: Multiplanar, multisequence MR imaging of the left foot was
performed. No intravenous contrast was administered.

[Series 4: T1 · coronal · 3.0mm · 0.19mm/px · 3 of 47 slices shown (1 of 2)]
[im 6/47]
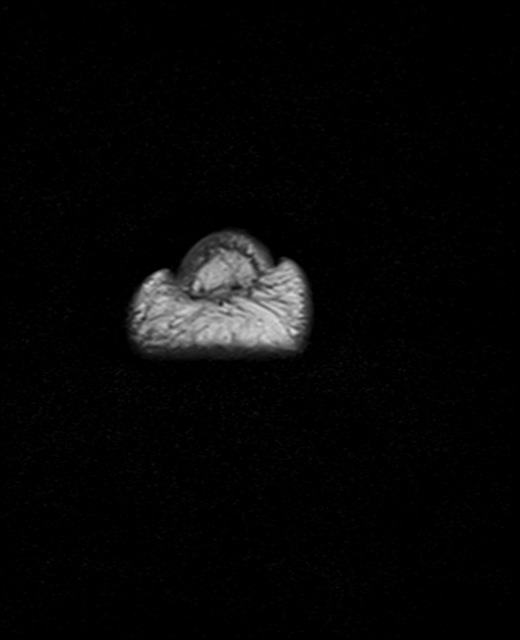
[im 26/47]
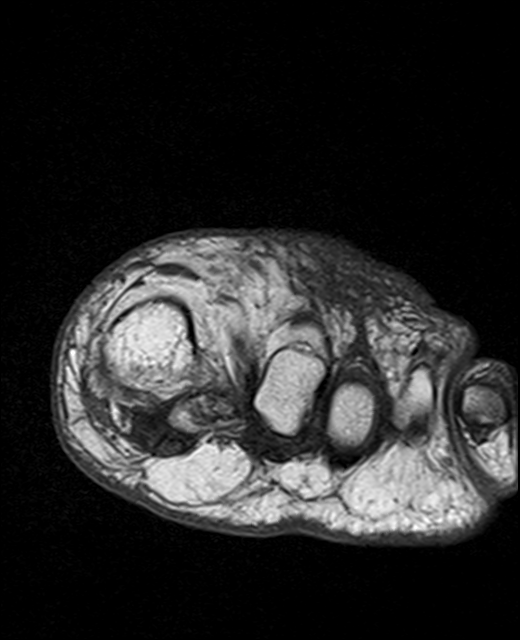
[im 41/47]
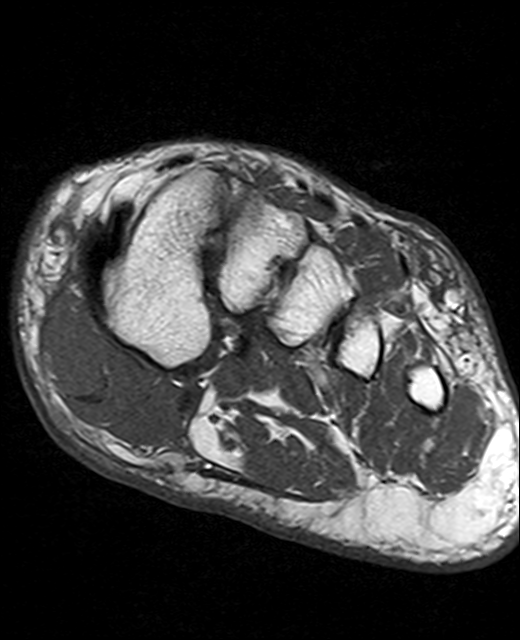

[Series 5: T2 fat-sat · coronal · 3.0mm · 0.19mm/px · 9 of 47 slices shown (1 of 2)]
[im 1/47]
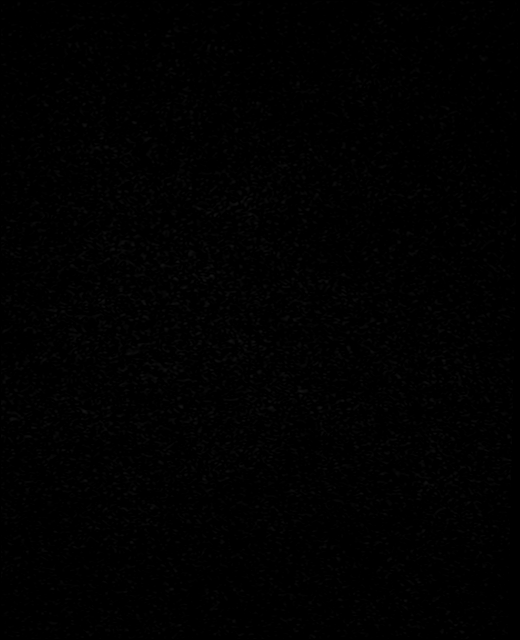
[im 5/47]
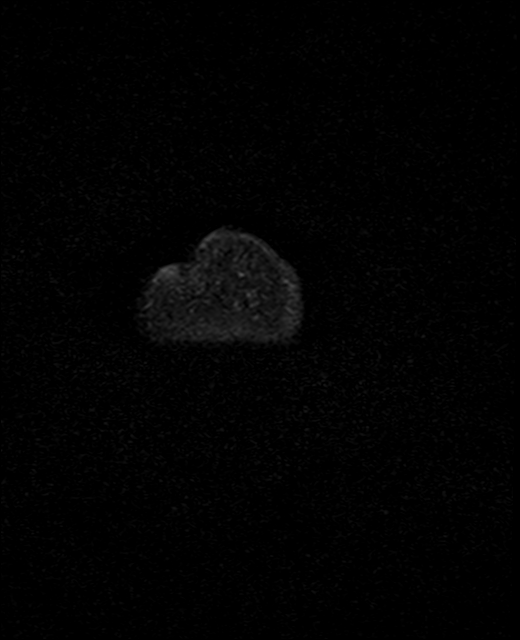
[im 10/47]
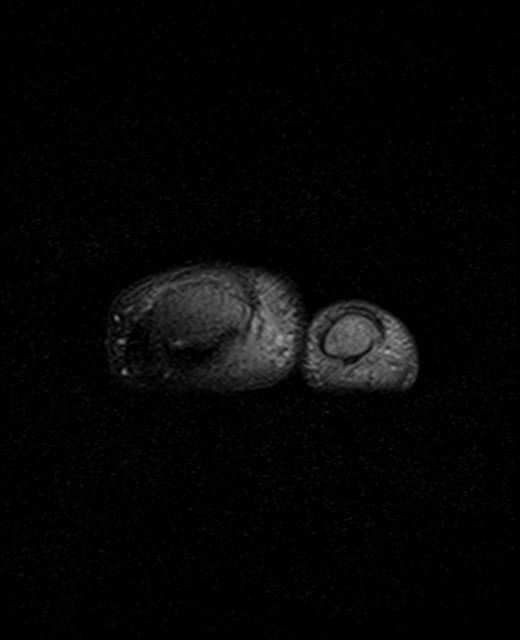
[im 14/47]
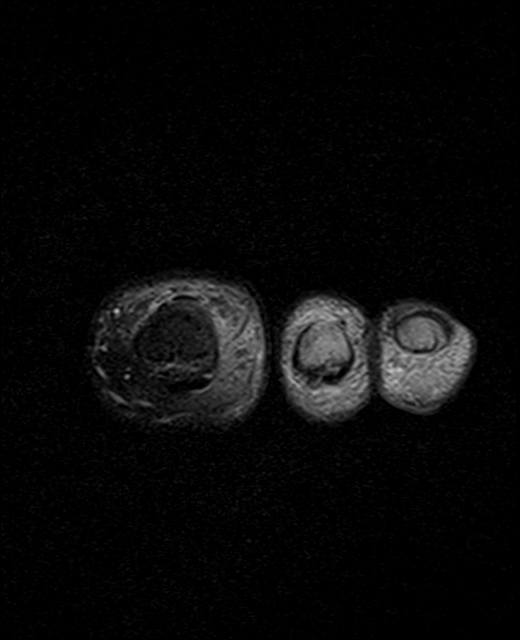
[im 19/47]
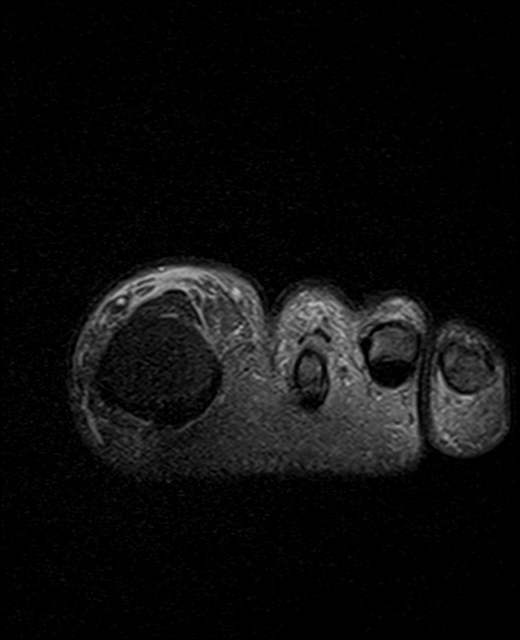
[im 24/47]
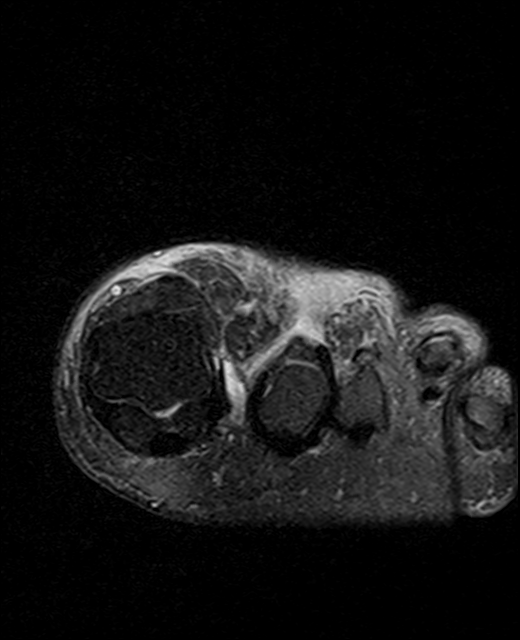
[im 28/47]
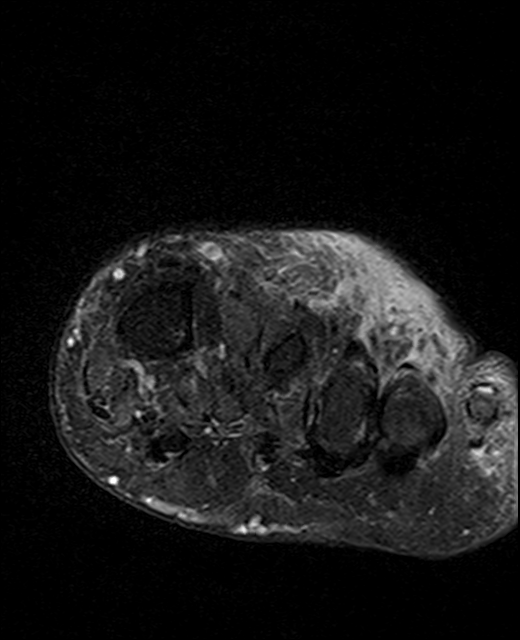
[im 33/47]
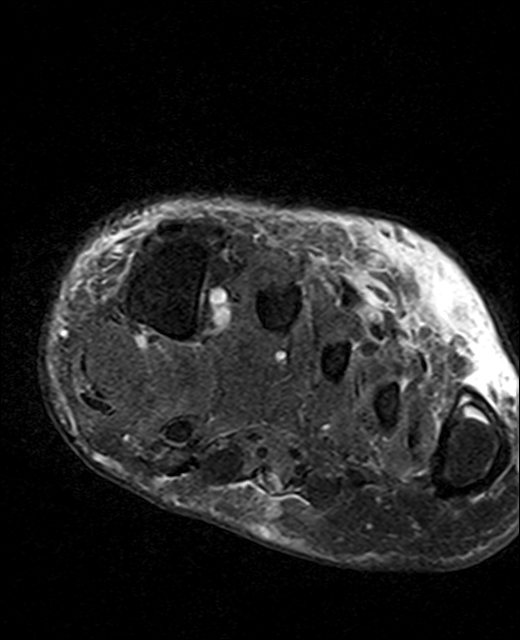
[im 42/47]
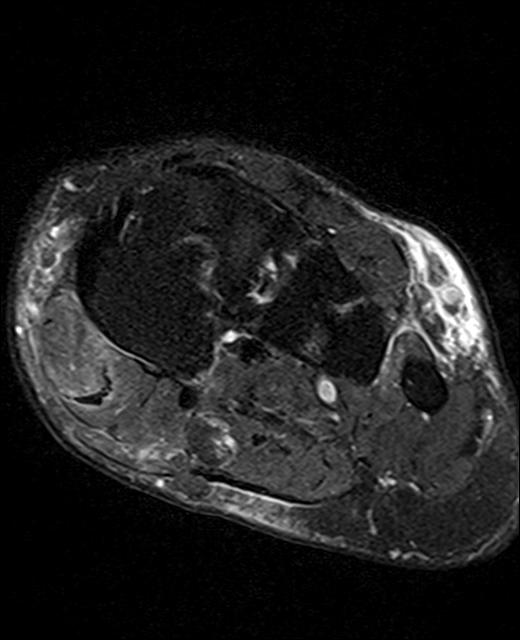

[Series 6: T2 fat-sat · oblique · 3.0mm · 0.35mm/px · 3 of 25 slices shown (2 of 2)]
[im 5/25]
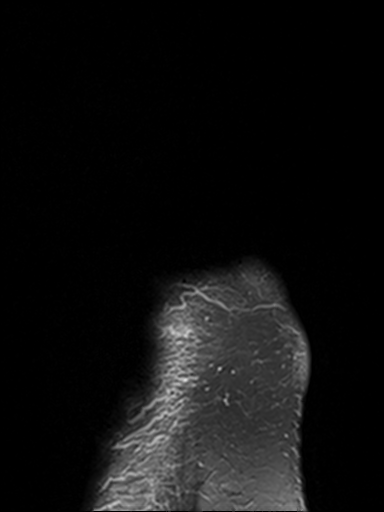
[im 15/25]
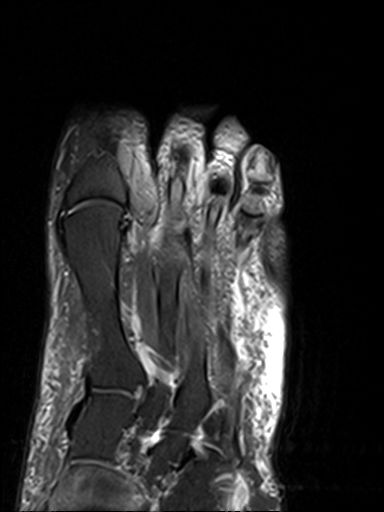
[im 25/25]
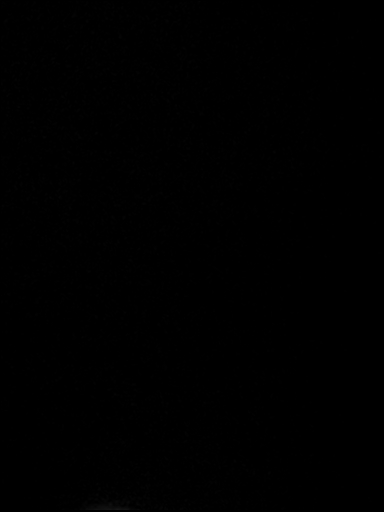

[Series 7: T1 · oblique · 3.0mm · 0.35mm/px · 3 of 25 slices shown (2 of 2)]
[im 5/25]
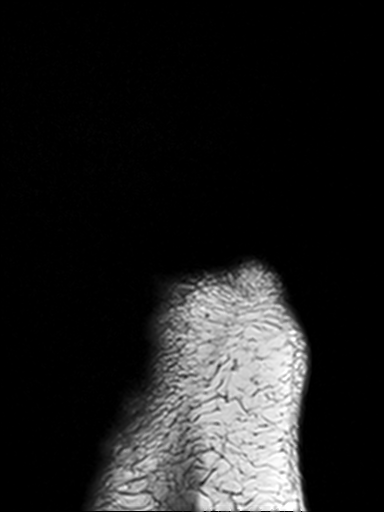
[im 15/25]
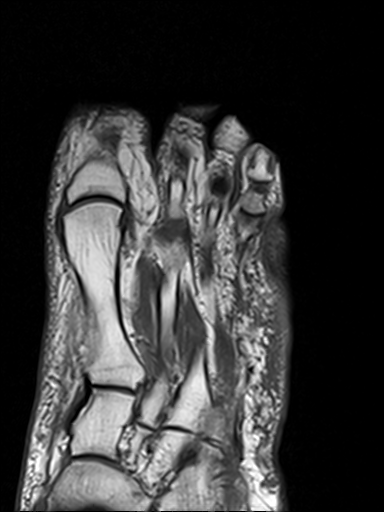
[im 25/25]
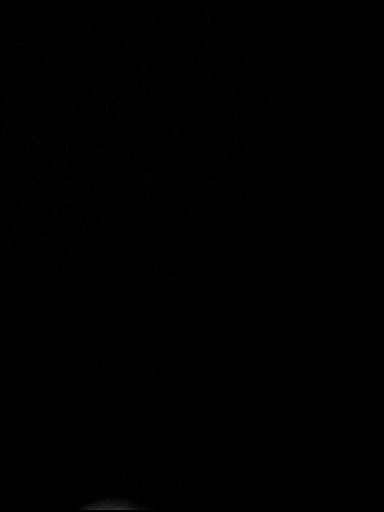

[18 of 40 positions shown; findings below may reference images not displayed]

FINDINGS: Bones/Joint/Cartilage

There is no evidence of fracture. No evidence of metatarsal stress
injury. The cortex is intact. No evidence of sesamoiditis. Partially
visualized talonavicular arthritis with periarticular bony edema.

Ligaments

Intact Lisfranc ligament.  No evidence of plantar plate tear

Muscles and Tendons

No significant muscle atrophy. No acute tendon tear in the forefoot.

Soft tissues

There is soft tissue swelling along the dorsal foot superficially.
There is no evidence of soft tissue mass. No evidence of
intermetatarsal neuroma or bursitis.
IMPRESSION: Dorsal superficial soft tissue swelling. No other acute abnormality
in the forefoot.

Partially visualized talonavicular arthritis. See separately
dictated ankle MRI for findings in the hindfoot.

## 2020-10-22 ENCOUNTER — Other Ambulatory Visit: Payer: Self-pay | Admitting: Podiatry

## 2020-11-06 DIAGNOSIS — I10 Essential (primary) hypertension: Secondary | ICD-10-CM | POA: Diagnosis not present

## 2020-11-06 DIAGNOSIS — Z794 Long term (current) use of insulin: Secondary | ICD-10-CM | POA: Diagnosis not present

## 2020-11-06 DIAGNOSIS — E1169 Type 2 diabetes mellitus with other specified complication: Secondary | ICD-10-CM | POA: Diagnosis not present

## 2020-12-20 DIAGNOSIS — Z8616 Personal history of COVID-19: Secondary | ICD-10-CM | POA: Diagnosis not present

## 2020-12-20 DIAGNOSIS — E7800 Pure hypercholesterolemia, unspecified: Secondary | ICD-10-CM | POA: Diagnosis not present

## 2020-12-20 DIAGNOSIS — G479 Sleep disorder, unspecified: Secondary | ICD-10-CM | POA: Diagnosis not present

## 2020-12-20 DIAGNOSIS — Z791 Long term (current) use of non-steroidal anti-inflammatories (NSAID): Secondary | ICD-10-CM | POA: Diagnosis not present

## 2020-12-20 DIAGNOSIS — E11610 Type 2 diabetes mellitus with diabetic neuropathic arthropathy: Secondary | ICD-10-CM | POA: Diagnosis not present

## 2020-12-20 DIAGNOSIS — Z7984 Long term (current) use of oral hypoglycemic drugs: Secondary | ICD-10-CM | POA: Diagnosis not present

## 2020-12-20 DIAGNOSIS — Z9114 Patient's other noncompliance with medication regimen: Secondary | ICD-10-CM | POA: Diagnosis not present

## 2020-12-20 DIAGNOSIS — Z87891 Personal history of nicotine dependence: Secondary | ICD-10-CM | POA: Diagnosis not present

## 2020-12-20 DIAGNOSIS — Z9181 History of falling: Secondary | ICD-10-CM | POA: Diagnosis not present

## 2020-12-20 DIAGNOSIS — I110 Hypertensive heart disease with heart failure: Secondary | ICD-10-CM | POA: Diagnosis not present

## 2020-12-20 DIAGNOSIS — I509 Heart failure, unspecified: Secondary | ICD-10-CM | POA: Diagnosis not present

## 2020-12-20 DIAGNOSIS — G4733 Obstructive sleep apnea (adult) (pediatric): Secondary | ICD-10-CM | POA: Diagnosis not present

## 2020-12-24 ENCOUNTER — Other Ambulatory Visit: Payer: Self-pay

## 2020-12-24 ENCOUNTER — Ambulatory Visit (INDEPENDENT_AMBULATORY_CARE_PROVIDER_SITE_OTHER): Payer: Medicare Other | Admitting: Podiatry

## 2020-12-24 DIAGNOSIS — M79674 Pain in right toe(s): Secondary | ICD-10-CM

## 2020-12-24 DIAGNOSIS — M2142 Flat foot [pes planus] (acquired), left foot: Secondary | ICD-10-CM

## 2020-12-24 DIAGNOSIS — M19072 Primary osteoarthritis, left ankle and foot: Secondary | ICD-10-CM

## 2020-12-24 DIAGNOSIS — B351 Tinea unguium: Secondary | ICD-10-CM | POA: Diagnosis not present

## 2020-12-24 DIAGNOSIS — M79675 Pain in left toe(s): Secondary | ICD-10-CM | POA: Diagnosis not present

## 2020-12-24 NOTE — Progress Notes (Signed)
Subjective:  45 y.o. male presenting today for routine foot care.  Patient states that he is very frustrated because his ankle continues to have pain.  He is unable to walk with the assistance of a cane due to stability and has collapsed arch of his left foot.  We have tried multiple conservative treatment modalities over the past few years including steroid injections and immobilization and the use of an ankle brace without any improvement.  The patient would like to discuss different treatment options and be able to walk without a cane potentially in the future   Past Medical History:  Diagnosis Date   Acute respiratory failure (HCC)    Allergic rhinitis    CAP (community acquired pneumonia)    Cardiac disease    CHF (congestive heart failure) (HCC)    Depression    Diabetes mellitus without complication (HCC)    Disturbance, sleep    ETOH abuse    Hyperlipidemia    Hypertension    Leukocytosis    Obesity    OSA (obstructive sleep apnea)    Sepsis (HCC)    Smoker    Tobacco abuse        Objective/Physical Exam General: The patient is alert and oriented x3 in no acute distress.  Dermatology: Skin is warm, dry and supple bilateral lower extremities. Negative for open lesions or macerations.  Hyperkeratotic elongated nails noted 1-5 bilateral  Vascular: Palpable pedal pulses bilaterally. No edema or erythema noted. Capillary refill within normal limits.  Neurological: Epicritic and protective threshold grossly intact bilaterally.   Musculoskeletal Exam: Range of motion within normal limits to all pedal and ankle joints bilateral. Muscle strength 5/5 in all groups bilateral.  Upon weightbearing there is a medial longitudinal arch collapse bilaterally. Remove foot valgus noted to the bilateral lower extremities with excessive pronation upon mid stance.  Radiographic Exam 09/19/2020:  Normal osseous mineralization.  Moderate degenerative changes noted to the left foot and ankle.   No fracture/dislocation/boney destruction.   Pes planus noted on radiographic exam lateral views. Decreased calcaneal inclination and metatarsal declination angle is noted. Anterior break in the cyma line noted on lateral views. Medial talar head to deviation noted on AP radiograph.    MR ANKLE LT WO CONTRAST 10/02/2020 IMPRESSION: Pes planovalgus with findings of lateral hindfoot impingement as described above.   Thickening of the posterior tibial tendon with possible tiny interstitial tear. Mild edema along the superomedial portion of the spring ligament consistent with a low-grade sprain.   Anterolateral subluxation of the peroneal tendons with tenosynovitis above and below the lateral malleolus.   Chronic proximal ATFL tear.   Talonavicular arthritis with periarticular bony edema and evidence of small subchondral fracture in the talar head.  Assessment: 1.  Acquired pes planovalgus deformity left 2.  Posterior tibial tendon dysfunction left 3.  Pain due to onychomycosis of toenails both   Plan of Care:  1. Patient was evaluated.  MRIs reviewed today 2.  Mechanical debridement of nails 1-5 bilateral performed using a nail nipper without incident or bleeding 3.  Currently the patient states that his A1c is currently at 11.  Surgery is not an option at the moment.  When the patient gets his blood glucose levels under better control under 8, we can further discuss surgery which would likely include triple arthrodesis of the left foot 4.  Return to clinic in 3 months for routine foot care and follow-up   [name], [name] [facility]  Dr. [name], [name]    [address] [place], [place] [address]                Office [phone]  Fax [phone]

## 2020-12-25 DIAGNOSIS — Z8616 Personal history of COVID-19: Secondary | ICD-10-CM | POA: Diagnosis not present

## 2020-12-25 DIAGNOSIS — G4733 Obstructive sleep apnea (adult) (pediatric): Secondary | ICD-10-CM | POA: Diagnosis not present

## 2020-12-25 DIAGNOSIS — I110 Hypertensive heart disease with heart failure: Secondary | ICD-10-CM | POA: Diagnosis not present

## 2020-12-25 DIAGNOSIS — E11610 Type 2 diabetes mellitus with diabetic neuropathic arthropathy: Secondary | ICD-10-CM | POA: Diagnosis not present

## 2020-12-25 DIAGNOSIS — Z87891 Personal history of nicotine dependence: Secondary | ICD-10-CM | POA: Diagnosis not present

## 2020-12-25 DIAGNOSIS — Z9181 History of falling: Secondary | ICD-10-CM | POA: Diagnosis not present

## 2020-12-25 DIAGNOSIS — I509 Heart failure, unspecified: Secondary | ICD-10-CM | POA: Diagnosis not present

## 2020-12-25 DIAGNOSIS — Z791 Long term (current) use of non-steroidal anti-inflammatories (NSAID): Secondary | ICD-10-CM | POA: Diagnosis not present

## 2020-12-25 DIAGNOSIS — Z7984 Long term (current) use of oral hypoglycemic drugs: Secondary | ICD-10-CM | POA: Diagnosis not present

## 2020-12-25 DIAGNOSIS — Z9114 Patient's other noncompliance with medication regimen: Secondary | ICD-10-CM | POA: Diagnosis not present

## 2020-12-25 DIAGNOSIS — E7800 Pure hypercholesterolemia, unspecified: Secondary | ICD-10-CM | POA: Diagnosis not present

## 2020-12-25 DIAGNOSIS — G479 Sleep disorder, unspecified: Secondary | ICD-10-CM | POA: Diagnosis not present

## 2020-12-28 DIAGNOSIS — Z7984 Long term (current) use of oral hypoglycemic drugs: Secondary | ICD-10-CM | POA: Diagnosis not present

## 2020-12-28 DIAGNOSIS — Z9181 History of falling: Secondary | ICD-10-CM | POA: Diagnosis not present

## 2020-12-28 DIAGNOSIS — E7800 Pure hypercholesterolemia, unspecified: Secondary | ICD-10-CM | POA: Diagnosis not present

## 2020-12-28 DIAGNOSIS — Z8616 Personal history of COVID-19: Secondary | ICD-10-CM | POA: Diagnosis not present

## 2020-12-28 DIAGNOSIS — Z791 Long term (current) use of non-steroidal anti-inflammatories (NSAID): Secondary | ICD-10-CM | POA: Diagnosis not present

## 2020-12-28 DIAGNOSIS — I509 Heart failure, unspecified: Secondary | ICD-10-CM | POA: Diagnosis not present

## 2020-12-28 DIAGNOSIS — E11610 Type 2 diabetes mellitus with diabetic neuropathic arthropathy: Secondary | ICD-10-CM | POA: Diagnosis not present

## 2020-12-28 DIAGNOSIS — Z9114 Patient's other noncompliance with medication regimen: Secondary | ICD-10-CM | POA: Diagnosis not present

## 2020-12-28 DIAGNOSIS — G4733 Obstructive sleep apnea (adult) (pediatric): Secondary | ICD-10-CM | POA: Diagnosis not present

## 2020-12-28 DIAGNOSIS — Z87891 Personal history of nicotine dependence: Secondary | ICD-10-CM | POA: Diagnosis not present

## 2020-12-28 DIAGNOSIS — G479 Sleep disorder, unspecified: Secondary | ICD-10-CM | POA: Diagnosis not present

## 2020-12-28 DIAGNOSIS — I110 Hypertensive heart disease with heart failure: Secondary | ICD-10-CM | POA: Diagnosis not present

## 2021-01-01 DIAGNOSIS — Z7984 Long term (current) use of oral hypoglycemic drugs: Secondary | ICD-10-CM | POA: Diagnosis not present

## 2021-01-01 DIAGNOSIS — I509 Heart failure, unspecified: Secondary | ICD-10-CM | POA: Diagnosis not present

## 2021-01-01 DIAGNOSIS — G479 Sleep disorder, unspecified: Secondary | ICD-10-CM | POA: Diagnosis not present

## 2021-01-01 DIAGNOSIS — Z9181 History of falling: Secondary | ICD-10-CM | POA: Diagnosis not present

## 2021-01-01 DIAGNOSIS — G4733 Obstructive sleep apnea (adult) (pediatric): Secondary | ICD-10-CM | POA: Diagnosis not present

## 2021-01-01 DIAGNOSIS — I110 Hypertensive heart disease with heart failure: Secondary | ICD-10-CM | POA: Diagnosis not present

## 2021-01-01 DIAGNOSIS — E11610 Type 2 diabetes mellitus with diabetic neuropathic arthropathy: Secondary | ICD-10-CM | POA: Diagnosis not present

## 2021-01-01 DIAGNOSIS — Z791 Long term (current) use of non-steroidal anti-inflammatories (NSAID): Secondary | ICD-10-CM | POA: Diagnosis not present

## 2021-01-01 DIAGNOSIS — Z87891 Personal history of nicotine dependence: Secondary | ICD-10-CM | POA: Diagnosis not present

## 2021-01-01 DIAGNOSIS — Z9114 Patient's other noncompliance with medication regimen: Secondary | ICD-10-CM | POA: Diagnosis not present

## 2021-01-01 DIAGNOSIS — E7800 Pure hypercholesterolemia, unspecified: Secondary | ICD-10-CM | POA: Diagnosis not present

## 2021-01-01 DIAGNOSIS — Z8616 Personal history of COVID-19: Secondary | ICD-10-CM | POA: Diagnosis not present

## 2021-01-10 DIAGNOSIS — I110 Hypertensive heart disease with heart failure: Secondary | ICD-10-CM | POA: Diagnosis not present

## 2021-01-10 DIAGNOSIS — I509 Heart failure, unspecified: Secondary | ICD-10-CM | POA: Diagnosis not present

## 2021-01-10 DIAGNOSIS — E7800 Pure hypercholesterolemia, unspecified: Secondary | ICD-10-CM | POA: Diagnosis not present

## 2021-01-10 DIAGNOSIS — Z7984 Long term (current) use of oral hypoglycemic drugs: Secondary | ICD-10-CM | POA: Diagnosis not present

## 2021-01-10 DIAGNOSIS — G479 Sleep disorder, unspecified: Secondary | ICD-10-CM | POA: Diagnosis not present

## 2021-01-10 DIAGNOSIS — Z791 Long term (current) use of non-steroidal anti-inflammatories (NSAID): Secondary | ICD-10-CM | POA: Diagnosis not present

## 2021-01-10 DIAGNOSIS — Z87891 Personal history of nicotine dependence: Secondary | ICD-10-CM | POA: Diagnosis not present

## 2021-01-10 DIAGNOSIS — E11610 Type 2 diabetes mellitus with diabetic neuropathic arthropathy: Secondary | ICD-10-CM | POA: Diagnosis not present

## 2021-01-10 DIAGNOSIS — Z8616 Personal history of COVID-19: Secondary | ICD-10-CM | POA: Diagnosis not present

## 2021-01-10 DIAGNOSIS — Z9114 Patient's other noncompliance with medication regimen: Secondary | ICD-10-CM | POA: Diagnosis not present

## 2021-01-10 DIAGNOSIS — G4733 Obstructive sleep apnea (adult) (pediatric): Secondary | ICD-10-CM | POA: Diagnosis not present

## 2021-01-10 DIAGNOSIS — Z9181 History of falling: Secondary | ICD-10-CM | POA: Diagnosis not present

## 2021-01-17 DIAGNOSIS — I509 Heart failure, unspecified: Secondary | ICD-10-CM | POA: Diagnosis not present

## 2021-01-17 DIAGNOSIS — E7800 Pure hypercholesterolemia, unspecified: Secondary | ICD-10-CM | POA: Diagnosis not present

## 2021-01-17 DIAGNOSIS — G4733 Obstructive sleep apnea (adult) (pediatric): Secondary | ICD-10-CM | POA: Diagnosis not present

## 2021-01-17 DIAGNOSIS — I110 Hypertensive heart disease with heart failure: Secondary | ICD-10-CM | POA: Diagnosis not present

## 2021-01-17 DIAGNOSIS — Z87891 Personal history of nicotine dependence: Secondary | ICD-10-CM | POA: Diagnosis not present

## 2021-01-17 DIAGNOSIS — Z9114 Patient's other noncompliance with medication regimen: Secondary | ICD-10-CM | POA: Diagnosis not present

## 2021-01-17 DIAGNOSIS — Z8616 Personal history of COVID-19: Secondary | ICD-10-CM | POA: Diagnosis not present

## 2021-01-17 DIAGNOSIS — Z791 Long term (current) use of non-steroidal anti-inflammatories (NSAID): Secondary | ICD-10-CM | POA: Diagnosis not present

## 2021-01-17 DIAGNOSIS — G479 Sleep disorder, unspecified: Secondary | ICD-10-CM | POA: Diagnosis not present

## 2021-01-17 DIAGNOSIS — Z7984 Long term (current) use of oral hypoglycemic drugs: Secondary | ICD-10-CM | POA: Diagnosis not present

## 2021-01-17 DIAGNOSIS — E11610 Type 2 diabetes mellitus with diabetic neuropathic arthropathy: Secondary | ICD-10-CM | POA: Diagnosis not present

## 2021-01-17 DIAGNOSIS — Z9181 History of falling: Secondary | ICD-10-CM | POA: Diagnosis not present

## 2021-01-22 DIAGNOSIS — Z791 Long term (current) use of non-steroidal anti-inflammatories (NSAID): Secondary | ICD-10-CM | POA: Diagnosis not present

## 2021-01-22 DIAGNOSIS — G4733 Obstructive sleep apnea (adult) (pediatric): Secondary | ICD-10-CM | POA: Diagnosis not present

## 2021-01-22 DIAGNOSIS — I509 Heart failure, unspecified: Secondary | ICD-10-CM | POA: Diagnosis not present

## 2021-01-22 DIAGNOSIS — Z7984 Long term (current) use of oral hypoglycemic drugs: Secondary | ICD-10-CM | POA: Diagnosis not present

## 2021-01-22 DIAGNOSIS — E11610 Type 2 diabetes mellitus with diabetic neuropathic arthropathy: Secondary | ICD-10-CM | POA: Diagnosis not present

## 2021-01-22 DIAGNOSIS — Z9114 Patient's other noncompliance with medication regimen: Secondary | ICD-10-CM | POA: Diagnosis not present

## 2021-01-22 DIAGNOSIS — E7800 Pure hypercholesterolemia, unspecified: Secondary | ICD-10-CM | POA: Diagnosis not present

## 2021-01-22 DIAGNOSIS — G479 Sleep disorder, unspecified: Secondary | ICD-10-CM | POA: Diagnosis not present

## 2021-01-22 DIAGNOSIS — Z87891 Personal history of nicotine dependence: Secondary | ICD-10-CM | POA: Diagnosis not present

## 2021-01-22 DIAGNOSIS — I110 Hypertensive heart disease with heart failure: Secondary | ICD-10-CM | POA: Diagnosis not present

## 2021-01-22 DIAGNOSIS — Z9181 History of falling: Secondary | ICD-10-CM | POA: Diagnosis not present

## 2021-01-22 DIAGNOSIS — Z8616 Personal history of COVID-19: Secondary | ICD-10-CM | POA: Diagnosis not present

## 2021-01-28 DIAGNOSIS — G479 Sleep disorder, unspecified: Secondary | ICD-10-CM | POA: Diagnosis not present

## 2021-01-28 DIAGNOSIS — Z9181 History of falling: Secondary | ICD-10-CM | POA: Diagnosis not present

## 2021-01-28 DIAGNOSIS — E7800 Pure hypercholesterolemia, unspecified: Secondary | ICD-10-CM | POA: Diagnosis not present

## 2021-01-28 DIAGNOSIS — Z8616 Personal history of COVID-19: Secondary | ICD-10-CM | POA: Diagnosis not present

## 2021-01-28 DIAGNOSIS — G4733 Obstructive sleep apnea (adult) (pediatric): Secondary | ICD-10-CM | POA: Diagnosis not present

## 2021-01-28 DIAGNOSIS — E11610 Type 2 diabetes mellitus with diabetic neuropathic arthropathy: Secondary | ICD-10-CM | POA: Diagnosis not present

## 2021-01-28 DIAGNOSIS — I110 Hypertensive heart disease with heart failure: Secondary | ICD-10-CM | POA: Diagnosis not present

## 2021-01-28 DIAGNOSIS — Z791 Long term (current) use of non-steroidal anti-inflammatories (NSAID): Secondary | ICD-10-CM | POA: Diagnosis not present

## 2021-01-28 DIAGNOSIS — Z87891 Personal history of nicotine dependence: Secondary | ICD-10-CM | POA: Diagnosis not present

## 2021-01-28 DIAGNOSIS — Z9114 Patient's other noncompliance with medication regimen: Secondary | ICD-10-CM | POA: Diagnosis not present

## 2021-01-28 DIAGNOSIS — I509 Heart failure, unspecified: Secondary | ICD-10-CM | POA: Diagnosis not present

## 2021-01-28 DIAGNOSIS — Z7984 Long term (current) use of oral hypoglycemic drugs: Secondary | ICD-10-CM | POA: Diagnosis not present

## 2021-02-04 DIAGNOSIS — G4733 Obstructive sleep apnea (adult) (pediatric): Secondary | ICD-10-CM | POA: Diagnosis not present

## 2021-02-04 DIAGNOSIS — Z9181 History of falling: Secondary | ICD-10-CM | POA: Diagnosis not present

## 2021-02-04 DIAGNOSIS — Z87891 Personal history of nicotine dependence: Secondary | ICD-10-CM | POA: Diagnosis not present

## 2021-02-04 DIAGNOSIS — E7800 Pure hypercholesterolemia, unspecified: Secondary | ICD-10-CM | POA: Diagnosis not present

## 2021-02-04 DIAGNOSIS — I110 Hypertensive heart disease with heart failure: Secondary | ICD-10-CM | POA: Diagnosis not present

## 2021-02-04 DIAGNOSIS — Z8616 Personal history of COVID-19: Secondary | ICD-10-CM | POA: Diagnosis not present

## 2021-02-04 DIAGNOSIS — Z791 Long term (current) use of non-steroidal anti-inflammatories (NSAID): Secondary | ICD-10-CM | POA: Diagnosis not present

## 2021-02-04 DIAGNOSIS — E11610 Type 2 diabetes mellitus with diabetic neuropathic arthropathy: Secondary | ICD-10-CM | POA: Diagnosis not present

## 2021-02-04 DIAGNOSIS — Z9114 Patient's other noncompliance with medication regimen: Secondary | ICD-10-CM | POA: Diagnosis not present

## 2021-02-04 DIAGNOSIS — I509 Heart failure, unspecified: Secondary | ICD-10-CM | POA: Diagnosis not present

## 2021-02-04 DIAGNOSIS — G479 Sleep disorder, unspecified: Secondary | ICD-10-CM | POA: Diagnosis not present

## 2021-02-04 DIAGNOSIS — Z7984 Long term (current) use of oral hypoglycemic drugs: Secondary | ICD-10-CM | POA: Diagnosis not present

## 2021-02-11 DIAGNOSIS — Z87891 Personal history of nicotine dependence: Secondary | ICD-10-CM | POA: Diagnosis not present

## 2021-02-11 DIAGNOSIS — Z791 Long term (current) use of non-steroidal anti-inflammatories (NSAID): Secondary | ICD-10-CM | POA: Diagnosis not present

## 2021-02-11 DIAGNOSIS — Z7984 Long term (current) use of oral hypoglycemic drugs: Secondary | ICD-10-CM | POA: Diagnosis not present

## 2021-02-11 DIAGNOSIS — I509 Heart failure, unspecified: Secondary | ICD-10-CM | POA: Diagnosis not present

## 2021-02-11 DIAGNOSIS — Z9114 Patient's other noncompliance with medication regimen: Secondary | ICD-10-CM | POA: Diagnosis not present

## 2021-02-11 DIAGNOSIS — G4733 Obstructive sleep apnea (adult) (pediatric): Secondary | ICD-10-CM | POA: Diagnosis not present

## 2021-02-11 DIAGNOSIS — Z9181 History of falling: Secondary | ICD-10-CM | POA: Diagnosis not present

## 2021-02-11 DIAGNOSIS — I110 Hypertensive heart disease with heart failure: Secondary | ICD-10-CM | POA: Diagnosis not present

## 2021-02-11 DIAGNOSIS — E11610 Type 2 diabetes mellitus with diabetic neuropathic arthropathy: Secondary | ICD-10-CM | POA: Diagnosis not present

## 2021-02-11 DIAGNOSIS — Z8616 Personal history of COVID-19: Secondary | ICD-10-CM | POA: Diagnosis not present

## 2021-02-11 DIAGNOSIS — E7800 Pure hypercholesterolemia, unspecified: Secondary | ICD-10-CM | POA: Diagnosis not present

## 2021-02-11 DIAGNOSIS — G479 Sleep disorder, unspecified: Secondary | ICD-10-CM | POA: Diagnosis not present

## 2021-03-12 DIAGNOSIS — E1165 Type 2 diabetes mellitus with hyperglycemia: Secondary | ICD-10-CM | POA: Diagnosis not present

## 2021-03-12 DIAGNOSIS — Z7984 Long term (current) use of oral hypoglycemic drugs: Secondary | ICD-10-CM | POA: Diagnosis not present

## 2021-03-12 DIAGNOSIS — I10 Essential (primary) hypertension: Secondary | ICD-10-CM | POA: Diagnosis not present

## 2021-03-12 DIAGNOSIS — Z0000 Encounter for general adult medical examination without abnormal findings: Secondary | ICD-10-CM | POA: Diagnosis not present

## 2021-03-12 DIAGNOSIS — E11610 Type 2 diabetes mellitus with diabetic neuropathic arthropathy: Secondary | ICD-10-CM | POA: Diagnosis not present

## 2021-03-12 DIAGNOSIS — F17210 Nicotine dependence, cigarettes, uncomplicated: Secondary | ICD-10-CM | POA: Diagnosis not present

## 2021-03-12 DIAGNOSIS — Z79899 Other long term (current) drug therapy: Secondary | ICD-10-CM | POA: Diagnosis not present

## 2021-03-12 DIAGNOSIS — E785 Hyperlipidemia, unspecified: Secondary | ICD-10-CM | POA: Diagnosis not present

## 2021-03-12 DIAGNOSIS — E559 Vitamin D deficiency, unspecified: Secondary | ICD-10-CM | POA: Diagnosis not present

## 2021-03-12 DIAGNOSIS — G4733 Obstructive sleep apnea (adult) (pediatric): Secondary | ICD-10-CM | POA: Diagnosis not present

## 2021-03-12 DIAGNOSIS — Z23 Encounter for immunization: Secondary | ICD-10-CM | POA: Diagnosis not present

## 2021-03-14 DIAGNOSIS — I10 Essential (primary) hypertension: Secondary | ICD-10-CM | POA: Diagnosis not present

## 2021-03-14 DIAGNOSIS — E11610 Type 2 diabetes mellitus with diabetic neuropathic arthropathy: Secondary | ICD-10-CM | POA: Diagnosis not present

## 2021-03-14 DIAGNOSIS — E785 Hyperlipidemia, unspecified: Secondary | ICD-10-CM | POA: Diagnosis not present

## 2021-03-14 DIAGNOSIS — E1165 Type 2 diabetes mellitus with hyperglycemia: Secondary | ICD-10-CM | POA: Diagnosis not present

## 2021-03-14 DIAGNOSIS — E1169 Type 2 diabetes mellitus with other specified complication: Secondary | ICD-10-CM | POA: Diagnosis not present

## 2021-03-25 ENCOUNTER — Ambulatory Visit: Payer: Medicare Other | Admitting: Podiatry

## 2021-04-01 ENCOUNTER — Other Ambulatory Visit: Payer: Self-pay

## 2021-04-01 ENCOUNTER — Ambulatory Visit (INDEPENDENT_AMBULATORY_CARE_PROVIDER_SITE_OTHER): Payer: Commercial Managed Care - HMO | Admitting: Podiatry

## 2021-04-01 DIAGNOSIS — M79675 Pain in left toe(s): Secondary | ICD-10-CM

## 2021-04-01 DIAGNOSIS — B351 Tinea unguium: Secondary | ICD-10-CM

## 2021-04-01 DIAGNOSIS — M79674 Pain in right toe(s): Secondary | ICD-10-CM

## 2021-04-01 MED ORDER — DICLOFENAC SODIUM 75 MG PO TBEC: 75.0000 mg | DELAYED_RELEASE_TABLET | Freq: Two times a day (BID) | ORAL | 3 refills | Status: DC

## 2021-04-01 NOTE — Progress Notes (Addendum)
° °  SUBJECTIVE Patient presents to office today complaining of elongated, thickened nails that cause pain while ambulating in shoes.  Patient is unable to trim their own nails. Patient is here for further evaluation and treatment.  Past Medical History:  Diagnosis Date   Acute respiratory failure (HCC)    Allergic rhinitis    CAP (community acquired pneumonia)    Cardiac disease    CHF (congestive heart failure) (HCC)    Depression    Diabetes mellitus without complication (HCC)    Disturbance, sleep    ETOH abuse    Hyperlipidemia    Hypertension    Leukocytosis    Obesity    OSA (obstructive sleep apnea)    Sepsis (HCC)    Smoker    Tobacco abuse     OBJECTIVE General Patient is awake, alert, and oriented x 3 and in no acute distress. Derm Skin is dry and supple bilateral. Negative open lesions or macerations. Remaining integument unremarkable. Nails are tender, long, thickened and dystrophic with subungual debris, consistent with onychomycosis, 1-5 bilateral. No signs of infection noted. Vasc  DP and PT pedal pulses palpable bilaterally. Temperature gradient within normal limits.  Neuro Epicritic and protective threshold sensation grossly intact bilaterally.  Musculoskeletal Exam chronic collapse of the medial longitudinal arch and advanced PTTD with arthritis noted bilateral lower extremities left greater than the right  ASSESSMENT 1.  Pain due to onychomycosis of toenails both  PLAN OF CARE 1. Patient evaluated today.  2. Instructed to maintain good pedal hygiene and foot care.  3. Mechanical debridement of nails 1-5 bilaterally performed using a nail nipper. Filed with dremel without incident.  4.  Refill prescription for diclofenac 75 mg 2 times daily  5.  Return to clinic in 3 mos. patient continues to work on lowering his A1c levels so we can consider reconstructive surgery/triple arthrodesis of the left foot.  He is working closely with his PCP.   [name],  [name] [facility]  Dr. [name], [name]    [address] [place], [place] [address]                Office [phone]  Fax [phone]

## 2021-04-01 NOTE — Addendum Note (Signed)
Addended by: [name] on: 04/01/2021 02:01 PM   Modules accepted: Orders

## 2021-04-12 DIAGNOSIS — L03011 Cellulitis of right finger: Secondary | ICD-10-CM | POA: Diagnosis not present

## 2021-04-16 DIAGNOSIS — E119 Type 2 diabetes mellitus without complications: Secondary | ICD-10-CM | POA: Diagnosis not present

## 2021-04-16 DIAGNOSIS — E785 Hyperlipidemia, unspecified: Secondary | ICD-10-CM | POA: Diagnosis not present

## 2021-04-16 DIAGNOSIS — E1169 Type 2 diabetes mellitus with other specified complication: Secondary | ICD-10-CM | POA: Diagnosis not present

## 2021-04-16 DIAGNOSIS — E1165 Type 2 diabetes mellitus with hyperglycemia: Secondary | ICD-10-CM | POA: Diagnosis not present

## 2021-04-16 DIAGNOSIS — I10 Essential (primary) hypertension: Secondary | ICD-10-CM | POA: Diagnosis not present

## 2021-04-16 DIAGNOSIS — E11610 Type 2 diabetes mellitus with diabetic neuropathic arthropathy: Secondary | ICD-10-CM | POA: Diagnosis not present

## 2021-06-12 DIAGNOSIS — E1342 Other specified diabetes mellitus with diabetic polyneuropathy: Secondary | ICD-10-CM | POA: Diagnosis not present

## 2021-06-12 DIAGNOSIS — E1169 Type 2 diabetes mellitus with other specified complication: Secondary | ICD-10-CM | POA: Diagnosis not present

## 2021-07-01 ENCOUNTER — Ambulatory Visit: Payer: Commercial Managed Care - HMO | Admitting: Podiatry

## 2021-07-10 ENCOUNTER — Encounter: Payer: Self-pay | Admitting: Podiatry

## 2021-07-10 ENCOUNTER — Ambulatory Visit (INDEPENDENT_AMBULATORY_CARE_PROVIDER_SITE_OTHER): Payer: Medicare Other | Admitting: Podiatry

## 2021-07-10 DIAGNOSIS — B351 Tinea unguium: Secondary | ICD-10-CM | POA: Diagnosis not present

## 2021-07-10 DIAGNOSIS — M2142 Flat foot [pes planus] (acquired), left foot: Secondary | ICD-10-CM

## 2021-07-10 DIAGNOSIS — M79675 Pain in left toe(s): Secondary | ICD-10-CM | POA: Diagnosis not present

## 2021-07-10 DIAGNOSIS — M79674 Pain in right toe(s): Secondary | ICD-10-CM | POA: Diagnosis not present

## 2021-07-10 DIAGNOSIS — M19072 Primary osteoarthritis, left ankle and foot: Secondary | ICD-10-CM

## 2021-07-10 NOTE — Progress Notes (Signed)
? ?SUBJECTIVE ?Patient presents to office today complaining of elongated, thickened nails that cause pain while ambulating in shoes.  Patient is unable to trim their own nails.  Patient also states that he has worked hard to get his sugar under control.  He has been working with his PCP to reduce his A1c levels.  We have discussed surgery in the past and he would like to proceed with possible surgery at this time.  He presents for surgical consultation.  Patient is here for further evaluation and treatment. ? ?Past Medical History:  ?Diagnosis Date  ? Acute respiratory failure (HCC)   ? Allergic rhinitis   ? CAP (community acquired pneumonia)   ? Cardiac disease   ? CHF (congestive heart failure) (HCC)   ? Depression   ? Diabetes mellitus without complication (HCC)   ? Disturbance, sleep   ? ETOH abuse   ? Hyperlipidemia   ? Hypertension   ? Leukocytosis   ? Obesity   ? OSA (obstructive sleep apnea)   ? Sepsis (HCC)   ? Smoker   ? Tobacco abuse   ? ?Past Surgical History:  ?Procedure Laterality Date  ? NOSE SURGERY    ? ?Allergies  ?Allergen Reactions  ? Metformin Hcl Other (See Comments)  ? Pioglitazone Other (See Comments)  ? ? ? ?OBJECTIVE ?General Patient is awake, alert, and oriented x 3 and in no acute distress. ?Derm Skin is dry and supple bilateral. Negative open lesions or macerations. Remaining integument unremarkable. Nails are tender, long, thickened and dystrophic with subungual debris, consistent with onychomycosis, 1-5 bilateral. No signs of infection noted. ?Vasc  DP and PT pedal pulses palpable bilaterally. Temperature gradient within normal limits.  ?Neuro Epicritic and protective threshold sensation grossly intact bilaterally.  ?Musculoskeletal Exam chronic collapse of the medial longitudinal arch and advanced PTTD with arthritis noted bilateral lower extremities left greater than the right.  Pain on palpation throughout the midfoot and rear foot left.  Limited ankle joint dorsiflexion also  noted. ?MR ANKLE LT WO CONTRAST 10/01/2020: ?IMPRESSION: ?Pes planovalgus with findings of lateral hindfoot impingement as ?described above. ?  ?Thickening of the posterior tibial tendon with possible tiny ?interstitial tear. Mild edema along the superomedial portion of the ?spring ligament consistent with a low-grade sprain. ?  ?Anterolateral subluxation of the peroneal tendons with tenosynovitis ?above and below the lateral malleolus. ?  ?Chronic proximal ATFL tear. ?  ?Talonavicular arthritis with periarticular bony edema and evidence ?of small subchondral fracture in the talar head. ? ?ASSESSMENT ?1.  Pain due to onychomycosis of toenails both ?2.  Pes planovalgus deformity LLE ?3.  Equinus left ? ?PLAN OF CARE ?1. Patient evaluated today.  ?2. Instructed to maintain good pedal hygiene and foot care.  ?3. Mechanical debridement of nails 1-5 bilaterally performed using a nail nipper. Filed with dremel without incident.  ?4.  Today we discussed in detail reconstructive surgery/triple arthrodesis of the left foot.  Patient continues to have pain and tenderness on a daily basis.  He has tried multiple conservative modalities including ankle bracing, injections, immobilization in a cam boot, diabetic insoles and shoes, with no significant alleviation of his symptoms.  MRI taken July 2022 demonstrated advanced degenerative changes throughout the rear foot tarsal bones.  The patient would like to proceed with surgery.  We have discussed surgery on multiple visits in the past.  Patient states that he has been working with his PCP and currently his A1c levels are around 7.0.  Today we discussed  the surgery in detail and all possible complications were discussed.  All patient questions answered.  No guarantees were expressed or implied.  Postoperative recovery course was also explained in detail.  The patient would like to proceed with surgery ?5.  Authorization for surgery was initiated today.  Surgery will consist of  triple arthrodesis left.  Tendo Achilles lengthening left. ?6.  Medical clearance required from PCP.  Past medical history was reviewed in detail. ?8.  Return to clinic 1 week postop ? ? ?[name], [name] ?[facility] ? ?Dr. [name], [name]  ?  ?[address]                                     ?[place], [place] [address]                ?Office [phone]  ?Fax [phone] ? ? ? ? ?

## 2021-07-11 DIAGNOSIS — I10 Essential (primary) hypertension: Secondary | ICD-10-CM | POA: Diagnosis not present

## 2021-07-11 DIAGNOSIS — E1165 Type 2 diabetes mellitus with hyperglycemia: Secondary | ICD-10-CM | POA: Diagnosis not present

## 2021-07-11 DIAGNOSIS — E538 Deficiency of other specified B group vitamins: Secondary | ICD-10-CM | POA: Diagnosis not present

## 2021-07-18 ENCOUNTER — Ambulatory Visit: Payer: Medicare Other

## 2021-07-18 DIAGNOSIS — M2142 Flat foot [pes planus] (acquired), left foot: Secondary | ICD-10-CM

## 2021-07-18 DIAGNOSIS — E119 Type 2 diabetes mellitus without complications: Secondary | ICD-10-CM

## 2021-07-18 NOTE — Progress Notes (Signed)
SITUATION ?Reason for Consult: Evaluation for Prefabricated Diabetic Shoes and Custom Diabetic Inserts. ?Patient / Caregiver Report: Patient would like well fitting shoes ? ?OBJECTIVE DATA: ?Patient History / Diagnosis:  ?  ICD-10-CM   ?1. Diabetes mellitus without complication (HCC)  E11.9   ?  ?2. Acquired pes planovalgus, left  M21.42   ?  ? ? ?Physician Treating Diabetes:  [name], MD ? ?Current or Previous Devices:   Current user ? ?In-Person Foot Examination: ?Ulcers & Callousing:   Historical ?Deformities:    Pes planus ?Sensation:    Compromised  ?Shoe Size:     12XW ? ?ORTHOTIC RECOMMENDATION ?Recommended Devices: ?- 1x pair prefabricated PDAC approved diabetic shoes; Patient Selected [employer] [place], black Size 12XW ?- 3x pair custom-to-patient PDAC approved vacuum formed diabetic insoles. ? ?GOALS OF SHOES AND INSOLES ?- Reduce shear and pressure ?- Reduce / Prevent callus formation ?- Reduce / Prevent ulceration ?- Protect the fragile healing compromised diabetic foot. ? ?Patient would benefit from diabetic shoes and inserts as patient has diabetes mellitus and the patient has one or more of the following conditions: ?- History of partial or complete amputation of the foot ?- History of previous foot ulceration. ?- History of pre-ulcerative callus ?- Peripheral neuropathy with evidence of callus formation ?- Foot deformity ?- Poor circulation ? ?ACTIONS PERFORMED ?Potential out of pocket cost was communicated to patient. Patient understood and consented to measurement and casting. Patient was casted for insoles via crush box and measured for shoes via brannock device. Procedure was explained and patient tolerated procedure well. All questions were answered and concerns addressed. Casts were shipped to central fabrication for HOLD until Certificate of Medical Necessity or otherwise necessary authorization from insurance is obtained. ? ?PLAN ?Shoes are to be ordered and casts released  from hold once all appropriate paperwork is complete. Patient is to be contacted and scheduled for fitting once shoes and insoles have been fabricated and received. ? ?

## 2021-08-06 DIAGNOSIS — E1169 Type 2 diabetes mellitus with other specified complication: Secondary | ICD-10-CM | POA: Diagnosis not present

## 2021-08-06 DIAGNOSIS — I10 Essential (primary) hypertension: Secondary | ICD-10-CM | POA: Diagnosis not present

## 2021-08-06 DIAGNOSIS — E785 Hyperlipidemia, unspecified: Secondary | ICD-10-CM | POA: Diagnosis not present

## 2021-08-09 DIAGNOSIS — M25579 Pain in unspecified ankle and joints of unspecified foot: Secondary | ICD-10-CM | POA: Diagnosis not present

## 2021-08-09 DIAGNOSIS — Z01818 Encounter for other preprocedural examination: Secondary | ICD-10-CM | POA: Diagnosis not present

## 2021-10-02 DIAGNOSIS — E1169 Type 2 diabetes mellitus with other specified complication: Secondary | ICD-10-CM | POA: Diagnosis not present

## 2021-10-02 DIAGNOSIS — E538 Deficiency of other specified B group vitamins: Secondary | ICD-10-CM | POA: Diagnosis not present

## 2021-10-09 ENCOUNTER — Ambulatory Visit (INDEPENDENT_AMBULATORY_CARE_PROVIDER_SITE_OTHER): Payer: Medicare Other | Admitting: Podiatry

## 2021-10-09 DIAGNOSIS — B351 Tinea unguium: Secondary | ICD-10-CM | POA: Diagnosis not present

## 2021-10-09 DIAGNOSIS — E119 Type 2 diabetes mellitus without complications: Secondary | ICD-10-CM

## 2021-10-09 DIAGNOSIS — M79674 Pain in right toe(s): Secondary | ICD-10-CM | POA: Diagnosis not present

## 2021-10-09 DIAGNOSIS — M79675 Pain in left toe(s): Secondary | ICD-10-CM

## 2021-10-09 NOTE — Progress Notes (Signed)
   Chief Complaint  Patient presents with   Foot Problem    Routine foot care    SUBJECTIVE Patient with a history of diabetes mellitus presents to office today complaining of elongated, thickened nails that cause pain while ambulating in shoes.  Patient is unable to trim their own nails.  Last visit authorization for surgery was initiated and surgery is on hold pending medical clearance and better management of his diabetes patient is here for further evaluation and treatment.   Past Medical History:  Diagnosis Date   Acute respiratory failure (HCC)    Allergic rhinitis    CAP (community acquired pneumonia)    Cardiac disease    CHF (congestive heart failure) (HCC)    Depression    Diabetes mellitus without complication (HCC)    Disturbance, sleep    ETOH abuse    Hyperlipidemia    Hypertension    Leukocytosis    Obesity    OSA (obstructive sleep apnea)    Sepsis (HCC)    Smoker    Tobacco abuse    Past Surgical History:  Procedure Laterality Date   NOSE SURGERY     Allergies  Allergen Reactions   Metformin Hcl Other (See Comments)   Pioglitazone Other (See Comments)    OBJECTIVE General Patient is awake, alert, and oriented x 3 and in no acute distress. Derm Skin is dry and supple bilateral. Negative open lesions or macerations. Remaining integument unremarkable. Nails are tender, long, thickened and dystrophic with subungual debris, consistent with onychomycosis, 1-5 bilateral. No signs of infection noted. Vasc  DP and PT pedal pulses palpable bilaterally. Temperature gradient within normal limits.  Neuro Epicritic and protective threshold sensation diminished bilaterally.  Musculoskeletal Exam chronic collapse of the medial longitudinal arch and advanced PTTD with arthritic changes noted bilateral lower extremities left greater than the right.  There is pain on palpation throughout the midfoot and rear foot left.  Limited ankle joint dorsiflexion  ASSESSMENT 1.  Diabetes Mellitus w/ peripheral neuropathy 2.  Pain due to onychomycosis of toenails bilateral 3.  Severe pes planovalgus deformity left lower extremity with equinus  PLAN OF CARE 1. Patient evaluated today.  Comprehensive diabetic foot exam performed today 2. Instructed to maintain good pedal hygiene and foot care. Stressed importance of controlling blood sugar.  3. Mechanical debridement of nails 1-5 bilaterally performed using a nail nipper. Filed with dremel without incident.  4.  Surgery is pending medical clearance.  Patient's last A1c was 8.7 according to the patient.  We will hold off on surgery for now until the patient has better management and control of his diabetes.  Authorization for surgery was initiated on 07/11/2018. 5.  In the meantime return to clinic 3 months for routine foot care    [name], [name] [facility]  Dr. [name], [name]    [address] [place], [place] [address]                Office [phone]  Fax [phone]

## 2021-10-21 ENCOUNTER — Other Ambulatory Visit: Payer: Self-pay | Admitting: Podiatry

## 2021-10-21 ENCOUNTER — Telehealth: Payer: Self-pay

## 2021-10-21 NOTE — Telephone Encounter (Signed)
Encounter created in error

## 2021-10-24 ENCOUNTER — Other Ambulatory Visit: Payer: Self-pay | Admitting: Podiatry

## 2021-10-25 NOTE — Telephone Encounter (Signed)
Please advise 

## 2021-11-04 DIAGNOSIS — I10 Essential (primary) hypertension: Secondary | ICD-10-CM | POA: Diagnosis not present

## 2021-11-04 DIAGNOSIS — E785 Hyperlipidemia, unspecified: Secondary | ICD-10-CM | POA: Diagnosis not present

## 2021-11-04 DIAGNOSIS — E1169 Type 2 diabetes mellitus with other specified complication: Secondary | ICD-10-CM | POA: Diagnosis not present

## 2021-11-19 ENCOUNTER — Ambulatory Visit (INDEPENDENT_AMBULATORY_CARE_PROVIDER_SITE_OTHER): Payer: Medicare Other | Admitting: Podiatry

## 2021-11-19 DIAGNOSIS — L989 Disorder of the skin and subcutaneous tissue, unspecified: Secondary | ICD-10-CM | POA: Diagnosis not present

## 2021-11-19 NOTE — Progress Notes (Signed)
   Chief Complaint  Patient presents with   foot care    Patient is here for routine foot care.Patient is diabetic.    SUBJECTIVE Patient with a history of diabetes mellitus presents to office today complaining of a symptomatic callus that developed to the medial longitudinal arch of the left foot.  Patient states that when he is barefoot he noticed pain and tenderness associated to this callus lesion.  This is been ongoing for few weeks now.  He presents for further treatment and evaluation   Past Medical History:  Diagnosis Date   Acute respiratory failure (HCC)    Allergic rhinitis    CAP (community acquired pneumonia)    Cardiac disease    CHF (congestive heart failure) (HCC)    Depression    Diabetes mellitus without complication (HCC)    Disturbance, sleep    ETOH abuse    Hyperlipidemia    Hypertension    Leukocytosis    Obesity    OSA (obstructive sleep apnea)    Sepsis (HCC)    Smoker    Tobacco abuse    Past Surgical History:  Procedure Laterality Date   NOSE SURGERY     Allergies  Allergen Reactions   Metformin Hcl Other (See Comments)   Pioglitazone Other (See Comments)    OBJECTIVE General Patient is awake, alert, and oriented x 3 and in no acute distress. Derm hyperkeratotic callus noted with a central nucleated core to the medial longitudinal arch of the left foot Vasc  DP and PT pedal pulses palpable bilaterally. Temperature gradient within normal limits.  Neuro Epicritic and protective threshold sensation diminished bilaterally.  Musculoskeletal Exam chronic collapse of the medial longitudinal arch and advanced PTTD with arthritic changes noted bilateral lower extremities left greater than the right.  There is pain on palpation throughout the midfoot and rear foot left.  Limited ankle joint dorsiflexion  ASSESSMENT 1. Diabetes Mellitus w/ peripheral neuropathy 2.  Symptomatic porokeratosis/callus left foot 3.  Severe pes planovalgus deformity left  lower extremity with equinus  PLAN OF CARE 1. Patient evaluated today.   2.  Excisional debridement of the hyperkeratotic callus lesion to the medial longitudinal arch of the left foot was performed today using a tissue nipper and 312 scalpel without incident or bleeding.  The patient felt relief 3.  Patient admits to going barefoot.  Recommend and advised against going barefoot and wearing good supportive shoes and sneakers 4.  Return to clinic 3 for routine foot care    [name], [name] [facility]  Dr. [name], [name]    [address] [place], [place] [address]                Office [phone]  Fax [phone]

## 2021-12-06 ENCOUNTER — Telehealth: Payer: Self-pay | Admitting: Podiatry

## 2021-12-06 NOTE — Telephone Encounter (Signed)
Pt calling to check the status of his diabetic shoe order. Pt states has called and left several messages but no return call.  Face sheet printed and placed on Amanda's desk to follow up.  Please advise.

## 2022-01-13 ENCOUNTER — Ambulatory Visit: Payer: Medicare Other | Admitting: Podiatry

## 2022-01-15 DIAGNOSIS — Z0000 Encounter for general adult medical examination without abnormal findings: Secondary | ICD-10-CM | POA: Diagnosis not present

## 2022-01-15 DIAGNOSIS — E538 Deficiency of other specified B group vitamins: Secondary | ICD-10-CM | POA: Diagnosis not present

## 2022-01-15 DIAGNOSIS — Z1159 Encounter for screening for other viral diseases: Secondary | ICD-10-CM | POA: Diagnosis not present

## 2022-01-15 DIAGNOSIS — Z91199 Patient's noncompliance with other medical treatment and regimen due to unspecified reason: Secondary | ICD-10-CM | POA: Diagnosis not present

## 2022-01-15 DIAGNOSIS — I10 Essential (primary) hypertension: Secondary | ICD-10-CM | POA: Diagnosis not present

## 2022-01-15 DIAGNOSIS — E1165 Type 2 diabetes mellitus with hyperglycemia: Secondary | ICD-10-CM | POA: Diagnosis not present

## 2022-01-15 DIAGNOSIS — F17210 Nicotine dependence, cigarettes, uncomplicated: Secondary | ICD-10-CM | POA: Diagnosis not present

## 2022-01-15 DIAGNOSIS — E559 Vitamin D deficiency, unspecified: Secondary | ICD-10-CM | POA: Diagnosis not present

## 2022-01-15 DIAGNOSIS — E785 Hyperlipidemia, unspecified: Secondary | ICD-10-CM | POA: Diagnosis not present

## 2022-01-15 DIAGNOSIS — Z23 Encounter for immunization: Secondary | ICD-10-CM | POA: Diagnosis not present

## 2022-01-21 ENCOUNTER — Telehealth: Payer: Self-pay | Admitting: Podiatry

## 2022-01-21 NOTE — Telephone Encounter (Signed)
Patient came in and wants an update on is diabetic shoes if you can give him a call with an update please

## 2022-01-23 NOTE — Telephone Encounter (Signed)
Documented on related encounter 

## 2022-01-23 NOTE — Telephone Encounter (Signed)
Called pt to discuss DM shoe order, no answer no vm set up

## 2022-02-24 ENCOUNTER — Ambulatory Visit (INDEPENDENT_AMBULATORY_CARE_PROVIDER_SITE_OTHER): Payer: Medicare Other | Admitting: Podiatry

## 2022-02-24 DIAGNOSIS — M79675 Pain in left toe(s): Secondary | ICD-10-CM

## 2022-02-24 DIAGNOSIS — B351 Tinea unguium: Secondary | ICD-10-CM

## 2022-02-24 DIAGNOSIS — M2142 Flat foot [pes planus] (acquired), left foot: Secondary | ICD-10-CM | POA: Diagnosis not present

## 2022-02-24 DIAGNOSIS — M79674 Pain in right toe(s): Secondary | ICD-10-CM | POA: Diagnosis not present

## 2022-02-24 NOTE — Progress Notes (Signed)
   Chief Complaint  Patient presents with   Callouses    Patient is here for callous on left foot.    SUBJECTIVE Patient with a history of diabetes mellitus presents to office today complaining of a symptomatic callus that developed to the medial longitudinal arch of the left foot.  Patient states that when he is barefoot he noticed pain and tenderness associated to this callus lesion.  Patient also requesting nail trim.  He says that he is unable to trim his nails and they are becoming symptomatic in his shoes.  Past Medical History:  Diagnosis Date   Acute respiratory failure (HCC)    Allergic rhinitis    CAP (community acquired pneumonia)    Cardiac disease    CHF (congestive heart failure) (HCC)    Depression    Diabetes mellitus without complication (HCC)    Disturbance, sleep    ETOH abuse    Hyperlipidemia    Hypertension    Leukocytosis    Obesity    OSA (obstructive sleep apnea)    Sepsis (HCC)    Smoker    Tobacco abuse    Past Surgical History:  Procedure Laterality Date   NOSE SURGERY     Allergies  Allergen Reactions   Metformin Hcl Other (See Comments)   Pioglitazone Other (See Comments)    OBJECTIVE General Patient is awake, alert, and oriented x 3 and in no acute distress. Derm hyperkeratotic callus noted with a central nucleated core to the medial longitudinal arch of the left foot.  Hyperkeratotic dystrophic elongated nails noted 1-5 bilateral Vasc  DP and PT pedal pulses palpable bilaterally. Temperature gradient within normal limits.  Neuro Epicritic and protective threshold sensation diminished bilaterally.  Musculoskeletal Exam chronic collapse of the medial longitudinal arch and advanced PTTD with arthritic changes noted bilateral lower extremities left greater than the right.  There is pain on palpation throughout the midfoot and rear foot left.  Limited ankle joint dorsiflexion  ASSESSMENT 1. Diabetes Mellitus w/ peripheral neuropathy 2.   Symptomatic porokeratosis/callus left foot 3.  Severe pes planovalgus deformity left lower extremity with equinus 4.  Pain due to onychomycosis of Toenails both  PLAN OF CARE 1. Patient evaluated today.  Mechanical debridement of nails 1-5 bilateral was performed using a nail nipper without incident or bleeding 2.  Excisional debridement of the hyperkeratotic callus lesion to the medial longitudinal arch of the left foot was performed today using a tissue nipper and 312 scalpel without incident or bleeding.  The patient felt relief 3.  Patient admits to going barefoot.  Recommend and advised against going barefoot and wearing good supportive shoes and sneakers 4.  Today also we discussed the patient's flatfoot/DJD to the left foot.  Currently his last A1c had slightly elevated.  Stressed the importance of reducing and better controlling his diabetes prior to any surgical considerations to the left foot.  Patient understands. 5.  Return to clinic 3 months    [name], [name] [facility]  Dr. [name], [name]    [address] [place], [place] [address]                Office [phone]  Fax [phone]

## 2022-04-11 DIAGNOSIS — I10 Essential (primary) hypertension: Secondary | ICD-10-CM | POA: Diagnosis not present

## 2022-04-11 DIAGNOSIS — E785 Hyperlipidemia, unspecified: Secondary | ICD-10-CM | POA: Diagnosis not present

## 2022-04-11 DIAGNOSIS — E1169 Type 2 diabetes mellitus with other specified complication: Secondary | ICD-10-CM | POA: Diagnosis not present

## 2022-05-15 DIAGNOSIS — E785 Hyperlipidemia, unspecified: Secondary | ICD-10-CM | POA: Diagnosis not present

## 2022-05-15 DIAGNOSIS — I10 Essential (primary) hypertension: Secondary | ICD-10-CM | POA: Diagnosis not present

## 2022-05-15 DIAGNOSIS — E1169 Type 2 diabetes mellitus with other specified complication: Secondary | ICD-10-CM | POA: Diagnosis not present

## 2022-05-19 DIAGNOSIS — Z9181 History of falling: Secondary | ICD-10-CM | POA: Diagnosis not present

## 2022-05-19 DIAGNOSIS — I10 Essential (primary) hypertension: Secondary | ICD-10-CM | POA: Diagnosis not present

## 2022-05-19 DIAGNOSIS — F17210 Nicotine dependence, cigarettes, uncomplicated: Secondary | ICD-10-CM | POA: Diagnosis not present

## 2022-05-19 DIAGNOSIS — S8992XA Unspecified injury of left lower leg, initial encounter: Secondary | ICD-10-CM | POA: Diagnosis not present

## 2022-05-19 DIAGNOSIS — Z79899 Other long term (current) drug therapy: Secondary | ICD-10-CM | POA: Diagnosis not present

## 2022-05-19 DIAGNOSIS — E1165 Type 2 diabetes mellitus with hyperglycemia: Secondary | ICD-10-CM | POA: Diagnosis not present

## 2022-05-26 ENCOUNTER — Ambulatory Visit (INDEPENDENT_AMBULATORY_CARE_PROVIDER_SITE_OTHER): Payer: 59 | Admitting: Podiatry

## 2022-05-26 DIAGNOSIS — M79674 Pain in right toe(s): Secondary | ICD-10-CM

## 2022-05-26 DIAGNOSIS — M79675 Pain in left toe(s): Secondary | ICD-10-CM | POA: Diagnosis not present

## 2022-05-26 DIAGNOSIS — B351 Tinea unguium: Secondary | ICD-10-CM

## 2022-05-26 DIAGNOSIS — M2142 Flat foot [pes planus] (acquired), left foot: Secondary | ICD-10-CM | POA: Diagnosis not present

## 2022-05-26 NOTE — Progress Notes (Signed)
No chief complaint on file.   SUBJECTIVE Patient with a history of diabetes mellitus presents to office today for follow-up evaluation of a symptomatic callus that developed to the medial longitudinal arch of the left foot.  Patient states that when he is barefoot he noticed pain and tenderness associated to this callus lesion.  Patient also requesting nail trim.  He says that he is unable to trim his nails and they are becoming symptomatic in his shoes.  Last documented A1c in the patient's chart is 06/12/2021 which was 7.0.  We have the discussed the possibility of reconstructive surgery for the patient's chronic severe flatfoot deformity however most recently the patient states that his A1c levels have been elevated.  We are continuing conservative treatment management for the moment  Past Medical History:  Diagnosis Date   Acute respiratory failure ([place])    Allergic rhinitis    CAP (community acquired pneumonia)    Cardiac disease    CHF (congestive heart failure) ([place])    Depression    Diabetes mellitus without complication (HCC)    Disturbance, sleep    ETOH abuse    Hyperlipidemia    Hypertension    Leukocytosis    Obesity    OSA (obstructive sleep apnea)    Sepsis ([place])    Smoker    Tobacco abuse    Past Surgical History:  Procedure Laterality Date   NOSE SURGERY     Allergies  Allergen Reactions   Metformin Hcl Other (See Comments)   Pioglitazone Other (See Comments)    OBJECTIVE General Patient is awake, alert, and oriented x 3 and in no acute distress. Derm hyperkeratotic callus noted with a central nucleated core to the medial longitudinal arch of the left foot.  Hyperkeratotic dystrophic elongated nails noted 1-5 bilateral Vasc  DP and PT pedal pulses palpable bilaterally. Temperature gradient within normal limits.  Neuro Epicritic and protective threshold sensation diminished bilaterally.  Musculoskeletal Exam chronic collapse of the medial longitudinal arch  and advanced PTTD with arthritic changes noted bilateral lower extremities left greater than the right.  There is pain on palpation throughout the midfoot and rear foot left.  Limited ankle joint dorsiflexion  ASSESSMENT 1. Diabetes Mellitus w/ peripheral neuropathy 2.  Symptomatic porokeratosis/callus left foot 3.  Severe pes planovalgus deformity left lower extremity with equinus 4.  Pain due to onychomycosis of Toenails both  PLAN OF CARE 1. Patient evaluated today.  Mechanical debridement of nails 1-5 bilateral was performed using a nail nipper without incident or bleeding 2.  Excisional debridement of the hyperkeratotic callus lesion to the medial longitudinal arch of the left foot was performed today using a tissue nipper and 312 scalpel without incident or bleeding.  The patient felt relief 3.  Patient admits to going barefoot.  Recommend and advised against going barefoot and wearing good supportive shoes and sneakers 4.  Today also we discussed the patient's flatfoot/DJD to the left foot.  Currently his last A1c had slightly elevated.  Stressed the importance of reducing and better controlling his diabetes prior to any surgical considerations to the left foot.  Patient understands. 5.  For now pursue conservative treatment for the chronic pes planovalgus DJD to the foot.  Appointment with diabetic shoe department for custom molded diabetic insoles and shoes  6.  Return to clinic 3 months    Edrick Kins, [name] [facility]  Dr. Edrick Kins, [name]    [address]  [place], [place] [address]                Office [phone]  Fax [phone]

## 2022-06-03 DIAGNOSIS — M25462 Effusion, left knee: Secondary | ICD-10-CM | POA: Diagnosis not present

## 2022-06-10 DIAGNOSIS — M25562 Pain in left knee: Secondary | ICD-10-CM | POA: Diagnosis not present

## 2022-06-19 DIAGNOSIS — M25562 Pain in left knee: Secondary | ICD-10-CM | POA: Diagnosis not present

## 2022-07-01 DIAGNOSIS — R262 Difficulty in walking, not elsewhere classified: Secondary | ICD-10-CM | POA: Diagnosis not present

## 2022-07-01 DIAGNOSIS — R531 Weakness: Secondary | ICD-10-CM | POA: Diagnosis not present

## 2022-07-01 DIAGNOSIS — M25662 Stiffness of left knee, not elsewhere classified: Secondary | ICD-10-CM | POA: Diagnosis not present

## 2022-07-08 DIAGNOSIS — R531 Weakness: Secondary | ICD-10-CM | POA: Diagnosis not present

## 2022-07-08 DIAGNOSIS — M25662 Stiffness of left knee, not elsewhere classified: Secondary | ICD-10-CM | POA: Diagnosis not present

## 2022-07-08 DIAGNOSIS — R262 Difficulty in walking, not elsewhere classified: Secondary | ICD-10-CM | POA: Diagnosis not present

## 2022-07-15 DIAGNOSIS — M25662 Stiffness of left knee, not elsewhere classified: Secondary | ICD-10-CM | POA: Diagnosis not present

## 2022-07-15 DIAGNOSIS — R531 Weakness: Secondary | ICD-10-CM | POA: Diagnosis not present

## 2022-07-15 DIAGNOSIS — R262 Difficulty in walking, not elsewhere classified: Secondary | ICD-10-CM | POA: Diagnosis not present

## 2022-07-17 DIAGNOSIS — M25662 Stiffness of left knee, not elsewhere classified: Secondary | ICD-10-CM | POA: Diagnosis not present

## 2022-07-22 DIAGNOSIS — M25662 Stiffness of left knee, not elsewhere classified: Secondary | ICD-10-CM | POA: Diagnosis not present

## 2022-07-22 DIAGNOSIS — R262 Difficulty in walking, not elsewhere classified: Secondary | ICD-10-CM | POA: Diagnosis not present

## 2022-07-22 DIAGNOSIS — R531 Weakness: Secondary | ICD-10-CM | POA: Diagnosis not present

## 2022-07-29 DIAGNOSIS — R531 Weakness: Secondary | ICD-10-CM | POA: Diagnosis not present

## 2022-07-29 DIAGNOSIS — R262 Difficulty in walking, not elsewhere classified: Secondary | ICD-10-CM | POA: Diagnosis not present

## 2022-07-29 DIAGNOSIS — M25662 Stiffness of left knee, not elsewhere classified: Secondary | ICD-10-CM | POA: Diagnosis not present

## 2022-08-05 DIAGNOSIS — R262 Difficulty in walking, not elsewhere classified: Secondary | ICD-10-CM | POA: Diagnosis not present

## 2022-08-05 DIAGNOSIS — M25662 Stiffness of left knee, not elsewhere classified: Secondary | ICD-10-CM | POA: Diagnosis not present

## 2022-08-05 DIAGNOSIS — R531 Weakness: Secondary | ICD-10-CM | POA: Diagnosis not present

## 2022-08-12 DIAGNOSIS — M25662 Stiffness of left knee, not elsewhere classified: Secondary | ICD-10-CM | POA: Diagnosis not present

## 2022-08-12 DIAGNOSIS — R262 Difficulty in walking, not elsewhere classified: Secondary | ICD-10-CM | POA: Diagnosis not present

## 2022-08-12 DIAGNOSIS — R531 Weakness: Secondary | ICD-10-CM | POA: Diagnosis not present

## 2022-08-19 DIAGNOSIS — R531 Weakness: Secondary | ICD-10-CM | POA: Diagnosis not present

## 2022-08-19 DIAGNOSIS — M25662 Stiffness of left knee, not elsewhere classified: Secondary | ICD-10-CM | POA: Diagnosis not present

## 2022-08-19 DIAGNOSIS — R262 Difficulty in walking, not elsewhere classified: Secondary | ICD-10-CM | POA: Diagnosis not present

## 2022-08-21 DIAGNOSIS — M25662 Stiffness of left knee, not elsewhere classified: Secondary | ICD-10-CM | POA: Diagnosis not present

## 2022-08-26 DIAGNOSIS — R531 Weakness: Secondary | ICD-10-CM | POA: Diagnosis not present

## 2022-08-26 DIAGNOSIS — M25662 Stiffness of left knee, not elsewhere classified: Secondary | ICD-10-CM | POA: Diagnosis not present

## 2022-08-26 DIAGNOSIS — R262 Difficulty in walking, not elsewhere classified: Secondary | ICD-10-CM | POA: Diagnosis not present

## 2022-08-27 ENCOUNTER — Ambulatory Visit (INDEPENDENT_AMBULATORY_CARE_PROVIDER_SITE_OTHER): Payer: 59 | Admitting: Podiatry

## 2022-08-27 DIAGNOSIS — M79675 Pain in left toe(s): Secondary | ICD-10-CM | POA: Diagnosis not present

## 2022-08-27 DIAGNOSIS — M79674 Pain in right toe(s): Secondary | ICD-10-CM | POA: Diagnosis not present

## 2022-08-27 DIAGNOSIS — B351 Tinea unguium: Secondary | ICD-10-CM

## 2022-08-27 NOTE — Progress Notes (Signed)
   Chief Complaint  Patient presents with   Diabetes    Diabetic foot care, nail trim, A1c-7.2 BG- 172    SUBJECTIVE Patient with a history of diabetes mellitus presents to office today for routine diabetic footcare.  Patient states that he is doing well.  No change in his feet.  He is unable to trim his own nails.  Past Medical History:  Diagnosis Date   Acute respiratory failure (HCC)    Allergic rhinitis    CAP (community acquired pneumonia)    Cardiac disease    CHF (congestive heart failure) (HCC)    Depression    Diabetes mellitus without complication (HCC)    Disturbance, sleep    ETOH abuse    Hyperlipidemia    Hypertension    Leukocytosis    Obesity    OSA (obstructive sleep apnea)    Sepsis (HCC)    Smoker    Tobacco abuse    Past Surgical History:  Procedure Laterality Date   NOSE SURGERY     Allergies  Allergen Reactions   Metformin Hcl Other (See Comments)   Pioglitazone Other (See Comments)    OBJECTIVE General Patient is awake, alert, and oriented x 3 and in no acute distress. Derm no calluses noted today.  Hyperkeratotic dystrophic elongated nails noted 1-5 bilateral Vasc  DP and PT pedal pulses palpable bilaterally. Temperature gradient within normal limits.  Neuro Epicritic and protective threshold sensation diminished bilaterally.  Musculoskeletal Exam chronic collapse of the medial longitudinal arch and advanced PTTD with arthritic changes noted bilateral lower extremities left greater than the right.  There is pain on palpation throughout the midfoot and rear foot left.  Limited ankle joint dorsiflexion  ASSESSMENT 1. Diabetes Mellitus w/ peripheral neuropathy 2.  Symptomatic porokeratosis/callus left foot 3.  Severe pes planovalgus deformity left lower extremity with equinus 4.  Pain due to onychomycosis of Toenails both  PLAN OF CARE -Patient evaluated. -Mechanical debridement of nails 1-5 bilateral was performed using a nail nipper  without incident or bleeding -Today the patient does not have any calluses to the feet. -Appointment with orthotics department for diabetic shoe and diabetic insole fitting -Return to clinic 3 months routine footcare   [name], [name] [facility]  Dr. [name], [name]    [address] [place], [place] [address]                Office [phone]  Fax [phone]

## 2022-09-01 DIAGNOSIS — R262 Difficulty in walking, not elsewhere classified: Secondary | ICD-10-CM | POA: Diagnosis not present

## 2022-09-01 DIAGNOSIS — M25662 Stiffness of left knee, not elsewhere classified: Secondary | ICD-10-CM | POA: Diagnosis not present

## 2022-09-01 DIAGNOSIS — R531 Weakness: Secondary | ICD-10-CM | POA: Diagnosis not present

## 2022-09-08 DIAGNOSIS — M25662 Stiffness of left knee, not elsewhere classified: Secondary | ICD-10-CM | POA: Diagnosis not present

## 2022-09-08 DIAGNOSIS — R262 Difficulty in walking, not elsewhere classified: Secondary | ICD-10-CM | POA: Diagnosis not present

## 2022-09-08 DIAGNOSIS — R531 Weakness: Secondary | ICD-10-CM | POA: Diagnosis not present

## 2022-09-15 DIAGNOSIS — R531 Weakness: Secondary | ICD-10-CM | POA: Diagnosis not present

## 2022-09-15 DIAGNOSIS — R262 Difficulty in walking, not elsewhere classified: Secondary | ICD-10-CM | POA: Diagnosis not present

## 2022-09-15 DIAGNOSIS — M25662 Stiffness of left knee, not elsewhere classified: Secondary | ICD-10-CM | POA: Diagnosis not present

## 2022-09-29 ENCOUNTER — Ambulatory Visit (INDEPENDENT_AMBULATORY_CARE_PROVIDER_SITE_OTHER): Payer: 59 | Admitting: Podiatry

## 2022-09-29 DIAGNOSIS — E119 Type 2 diabetes mellitus without complications: Secondary | ICD-10-CM

## 2022-09-29 NOTE — Progress Notes (Signed)
Patient presents today to be measured for diabetic shoes and insoles.  Patient was measured for  1 pair of diabetic shoes and 3 pairs of foam casted diabetic insoles.    Ht 5'5 Wt 236 Shoe size 11.5 xw  Shoe type black velcro    Treating phyiscian sharon wolters  Re-appointment for regularly scheduled diabetic foot care visits or if they should experience any trouble with the shoes or insoles.

## 2022-09-30 DIAGNOSIS — M25662 Stiffness of left knee, not elsewhere classified: Secondary | ICD-10-CM | POA: Diagnosis not present

## 2022-10-01 DIAGNOSIS — E1121 Type 2 diabetes mellitus with diabetic nephropathy: Secondary | ICD-10-CM | POA: Diagnosis not present

## 2022-10-01 DIAGNOSIS — E1165 Type 2 diabetes mellitus with hyperglycemia: Secondary | ICD-10-CM | POA: Diagnosis not present

## 2022-10-02 DIAGNOSIS — R262 Difficulty in walking, not elsewhere classified: Secondary | ICD-10-CM | POA: Diagnosis not present

## 2022-10-02 DIAGNOSIS — R531 Weakness: Secondary | ICD-10-CM | POA: Diagnosis not present

## 2022-10-02 DIAGNOSIS — M25662 Stiffness of left knee, not elsewhere classified: Secondary | ICD-10-CM | POA: Diagnosis not present

## 2022-10-14 DIAGNOSIS — M25661 Stiffness of right knee, not elsewhere classified: Secondary | ICD-10-CM | POA: Diagnosis not present

## 2022-10-14 DIAGNOSIS — R531 Weakness: Secondary | ICD-10-CM | POA: Diagnosis not present

## 2022-10-14 DIAGNOSIS — R262 Difficulty in walking, not elsewhere classified: Secondary | ICD-10-CM | POA: Diagnosis not present

## 2022-10-28 ENCOUNTER — Ambulatory Visit: Payer: 59

## 2022-10-28 DIAGNOSIS — M79674 Pain in right toe(s): Secondary | ICD-10-CM | POA: Diagnosis not present

## 2022-10-28 DIAGNOSIS — M79675 Pain in left toe(s): Secondary | ICD-10-CM | POA: Diagnosis not present

## 2022-10-28 DIAGNOSIS — E119 Type 2 diabetes mellitus without complications: Secondary | ICD-10-CM

## 2022-10-28 DIAGNOSIS — M19072 Primary osteoarthritis, left ankle and foot: Secondary | ICD-10-CM | POA: Diagnosis not present

## 2022-10-28 DIAGNOSIS — M2142 Flat foot [pes planus] (acquired), left foot: Secondary | ICD-10-CM | POA: Diagnosis not present

## 2022-10-28 DIAGNOSIS — B351 Tinea unguium: Secondary | ICD-10-CM | POA: Diagnosis not present

## 2022-10-28 NOTE — Progress Notes (Signed)

## 2022-11-27 ENCOUNTER — Ambulatory Visit: Payer: 59 | Admitting: Podiatry

## 2022-12-03 ENCOUNTER — Ambulatory Visit (INDEPENDENT_AMBULATORY_CARE_PROVIDER_SITE_OTHER): Payer: 59 | Admitting: Podiatry

## 2022-12-03 DIAGNOSIS — B351 Tinea unguium: Secondary | ICD-10-CM

## 2022-12-03 DIAGNOSIS — M79674 Pain in right toe(s): Secondary | ICD-10-CM | POA: Diagnosis not present

## 2022-12-03 DIAGNOSIS — M79675 Pain in left toe(s): Secondary | ICD-10-CM

## 2022-12-03 DIAGNOSIS — E119 Type 2 diabetes mellitus without complications: Secondary | ICD-10-CM

## 2022-12-03 DIAGNOSIS — M2142 Flat foot [pes planus] (acquired), left foot: Secondary | ICD-10-CM | POA: Diagnosis not present

## 2022-12-04 ENCOUNTER — Ambulatory Visit: Payer: 59 | Admitting: Podiatry

## 2022-12-14 NOTE — Progress Notes (Signed)
   Chief Complaint  Patient presents with   Nail Problem    Pt presents for dfc. Pt has no other complaints.    SUBJECTIVE Patient with a history of diabetes mellitus presents to office today for routine diabetic footcare.  Patient states that he is doing well.  No change in his feet.  He is unable to trim his own nails.  Past Medical History:  Diagnosis Date   Acute respiratory failure (HCC)    Allergic rhinitis    CAP (community acquired pneumonia)    Cardiac disease    CHF (congestive heart failure) (HCC)    Depression    Diabetes mellitus without complication (HCC)    Disturbance, sleep    ETOH abuse    Hyperlipidemia    Hypertension    Leukocytosis    Obesity    OSA (obstructive sleep apnea)    Sepsis (HCC)    Smoker    Tobacco abuse    Past Surgical History:  Procedure Laterality Date   NOSE SURGERY     Allergies  Allergen Reactions   Metformin Hcl Other (See Comments)   Pioglitazone Other (See Comments)    OBJECTIVE General Patient is awake, alert, and oriented x 3 and in no acute distress. Derm no calluses noted today.  Hyperkeratotic dystrophic elongated nails noted 1-5 bilateral Vasc  DP and PT pedal pulses palpable bilaterally. Temperature gradient within normal limits.  Neuro Epicritic and protective threshold sensation diminished bilaterally.  Musculoskeletal Exam chronic collapse of the medial longitudinal arch and advanced PTTD with arthritic changes noted bilateral lower extremities left greater than the right.  There is pain on palpation throughout the midfoot and rear foot left.  Limited ankle joint dorsiflexion  ASSESSMENT 1. Diabetes Mellitus w/ peripheral neuropathy 2.  Symptomatic porokeratosis/callus left foot 3.  Severe pes planovalgus deformity left lower extremity with equinus 4.  Pain due to onychomycosis of Toenails both  PLAN OF CARE -Patient evaluated. -Mechanical debridement of nails 1-5 bilateral was performed using a nail nipper  without incident or bleeding -Today the patient does not have any calluses to the feet. -Appointment with orthotics department for diabetic shoe and diabetic insole fitting -Return to clinic 3 months routine footcare   [name], [name] [facility]  Dr. [name], [name]    [address] [place], [place] [address]                Office [phone]  Fax [phone]

## 2023-03-04 ENCOUNTER — Encounter: Payer: Self-pay | Admitting: Podiatry

## 2023-03-04 ENCOUNTER — Ambulatory Visit (INDEPENDENT_AMBULATORY_CARE_PROVIDER_SITE_OTHER): Payer: 59 | Admitting: Podiatry

## 2023-03-04 DIAGNOSIS — M79674 Pain in right toe(s): Secondary | ICD-10-CM | POA: Diagnosis not present

## 2023-03-04 DIAGNOSIS — B351 Tinea unguium: Secondary | ICD-10-CM

## 2023-03-04 DIAGNOSIS — E119 Type 2 diabetes mellitus without complications: Secondary | ICD-10-CM | POA: Diagnosis not present

## 2023-03-04 DIAGNOSIS — M79675 Pain in left toe(s): Secondary | ICD-10-CM

## 2023-03-04 NOTE — Progress Notes (Signed)
   Chief Complaint  Patient presents with   [facility]    RM#[facility]     SUBJECTIVE Patient with a history of diabetes mellitus presents to office today for routine diabetic footcare.  Patient states that he is doing well.  No change in his feet.  He is unable to trim his own nails.  Patient also due for annual routine foot exam.  Past Medical History:  Diagnosis Date   Acute respiratory failure (HCC)    Allergic rhinitis    CAP (community acquired pneumonia)    Cardiac disease    CHF (congestive heart failure) (HCC)    Depression    Diabetes mellitus without complication (HCC)    Disturbance, sleep    ETOH abuse    Hyperlipidemia    Hypertension    Leukocytosis    Obesity    OSA (obstructive sleep apnea)    Sepsis (HCC)    Smoker    Tobacco abuse    Past Surgical History:  Procedure Laterality Date   NOSE SURGERY     Allergies  Allergen Reactions   Metformin Hcl Other (See Comments)   Pioglitazone Other (See Comments)    OBJECTIVE General Patient is awake, alert, and oriented x 3 and in no acute distress. Derm no calluses noted today.  Hyperkeratotic dystrophic elongated nails noted 1-5 bilateral Vasc  DP and PT pedal pulses palpable bilaterally. Temperature gradient within normal limits.  Neuro Epicritic and protective threshold sensation diminished bilaterally.  Musculoskeletal Exam chronic collapse of the medial longitudinal arch and advanced PTTD with arthritic changes noted bilateral lower extremities left greater than the right.  There is pain on palpation throughout the midfoot and rear foot left.  Limited ankle joint dorsiflexion  ASSESSMENT 1. Diabetes Mellitus w/ peripheral neuropathy 2.  Symptomatic porokeratosis/callus left foot 3.  Severe pes planovalgus deformity left lower extremity with equinus 4.  Pain due to onychomycosis of Toenails both 5.  Encounter for diabetic foot exam  PLAN OF CARE -Patient evaluated.  Comprehensive diabetic foot exam performed  today -Mechanical debridement of nails 1-5 bilateral was performed using a nail nipper without incident or bleeding -Today the patient does not have any calluses to the feet. -Appointment with orthotics department for diabetic shoe and diabetic insole fitting -Return to clinic 3 months routine footcare   [name], [name] [facility]  Dr. [name], [name]    [address] [place], [place] [address]                Office [phone]  Fax [phone]

## 2023-03-10 DIAGNOSIS — Z1211 Encounter for screening for malignant neoplasm of colon: Secondary | ICD-10-CM | POA: Diagnosis not present

## 2023-03-10 DIAGNOSIS — Z23 Encounter for immunization: Secondary | ICD-10-CM | POA: Diagnosis not present

## 2023-03-10 DIAGNOSIS — Z79899 Other long term (current) drug therapy: Secondary | ICD-10-CM | POA: Diagnosis not present

## 2023-03-10 DIAGNOSIS — Z0000 Encounter for general adult medical examination without abnormal findings: Secondary | ICD-10-CM | POA: Diagnosis not present

## 2023-03-10 DIAGNOSIS — E538 Deficiency of other specified B group vitamins: Secondary | ICD-10-CM | POA: Diagnosis not present

## 2023-03-10 DIAGNOSIS — E559 Vitamin D deficiency, unspecified: Secondary | ICD-10-CM | POA: Diagnosis not present

## 2023-03-10 DIAGNOSIS — E1165 Type 2 diabetes mellitus with hyperglycemia: Secondary | ICD-10-CM | POA: Diagnosis not present

## 2023-06-03 ENCOUNTER — Ambulatory Visit: Payer: 59 | Admitting: Podiatry

## 2023-06-15 DIAGNOSIS — E1165 Type 2 diabetes mellitus with hyperglycemia: Secondary | ICD-10-CM | POA: Diagnosis not present

## 2023-06-15 DIAGNOSIS — E1121 Type 2 diabetes mellitus with diabetic nephropathy: Secondary | ICD-10-CM | POA: Diagnosis not present

## 2023-06-15 DIAGNOSIS — E1169 Type 2 diabetes mellitus with other specified complication: Secondary | ICD-10-CM | POA: Diagnosis not present

## 2023-07-01 DIAGNOSIS — F17210 Nicotine dependence, cigarettes, uncomplicated: Secondary | ICD-10-CM | POA: Diagnosis not present

## 2023-07-01 DIAGNOSIS — E1169 Type 2 diabetes mellitus with other specified complication: Secondary | ICD-10-CM | POA: Diagnosis not present

## 2023-07-01 DIAGNOSIS — D696 Thrombocytopenia, unspecified: Secondary | ICD-10-CM | POA: Diagnosis not present

## 2023-07-01 DIAGNOSIS — I10 Essential (primary) hypertension: Secondary | ICD-10-CM | POA: Diagnosis not present

## 2023-07-06 ENCOUNTER — Ambulatory Visit (INDEPENDENT_AMBULATORY_CARE_PROVIDER_SITE_OTHER): Admitting: Podiatry

## 2023-07-06 DIAGNOSIS — Z91199 Patient's noncompliance with other medical treatment and regimen due to unspecified reason: Secondary | ICD-10-CM

## 2023-07-14 NOTE — Progress Notes (Signed)
   Complete physical exam  Patient: Marc Galloway   DOB: 01/04/2001   48 y.o. Male  MRN: [id number]  Subjective:    No chief complaint on file.   Marc Galloway is a 48 y.o. male who presents today for a complete physical exam. She reports consuming a {diet types:17450} diet. {types:19826} She generally feels {DESC; WELL/FAIRLY WELL/POORLY:18703}. She reports sleeping {DESC; WELL/FAIRLY WELL/POORLY:18703}. She {does/does not:200015} have additional problems to discuss today.    Most recent fall risk assessment:    09/11/2021   10:42 AM  Fall Risk   Falls in the past year? 0  Number falls in past yr: 0  Injury with Fall? 0  Risk for fall due to : No Fall Risks  Follow up Falls evaluation completed     Most recent depression screenings:    09/11/2021   10:42 AM 08/02/2020   10:46 AM  PHQ 2/9 Scores  PHQ - 2 Score 0 0  PHQ- 9 Score 5     {VISON DENTAL STD PSA (Optional):27386}  {History (Optional):23778}  Patient Care Team: Christen Butter, NP as PCP - General (Nurse Practitioner)   Outpatient Medications Prior to Visit  Medication Sig   fluticasone (FLONASE) 50 MCG/ACT nasal spray Place 2 sprays into both nostrils in the morning and at bedtime. After 7 days, reduce to once daily.   norgestimate-ethinyl estradiol (SPRINTEC 28) 0.25-35 MG-MCG tablet Take 1 tablet by mouth daily.   Nystatin POWD Apply liberally to affected area 2 times per day   spironolactone (ALDACTONE) 100 MG tablet Take 1 tablet (100 mg total) by mouth daily.   No facility-administered medications prior to visit.    ROS        Objective:     There were no vitals taken for this visit. {Vitals History (Optional):23777}  Physical Exam   No results found for any visits on 10/17/21. {Show previous labs (optional):23779}    Assessment & Plan:    Routine Health Maintenance and Physical Exam  Immunization History  Administered Date(s) Administered   DTaP 03/20/1999, 05/16/1999,  07/25/1999, 04/09/2000, 10/24/2003   Hepatitis A 08/20/2007, 08/25/2008   Hepatitis B 01/05/1999, 02/12/1999, 07/25/1999   HiB (PRP-OMP) 03/20/1999, 05/16/1999, 07/25/1999, 04/09/2000   IPV 03/20/1999, 05/16/1999, 01/13/2000, 10/24/2003   Influenza,inj,Quad PF,6+ Mos 11/25/2013   Influenza-Unspecified 02/25/2012   MMR 01/12/2001, 10/24/2003   Meningococcal Polysaccharide 08/25/2011   Pneumococcal Conjugate-13 04/09/2000   Pneumococcal-Unspecified 07/25/1999, 10/08/1999   Tdap 08/25/2011   Varicella 01/13/2000, 08/20/2007    Health Maintenance  Topic Date Due   HIV Screening  Never done   Hepatitis C Screening  Never done   INFLUENZA VACCINE  10/15/2021   PAP-Cervical Cytology Screening  10/17/2021 (Originally 01/04/2020)   PAP SMEAR-Modifier  10/17/2021 (Originally 01/04/2020)   TETANUS/TDAP  10/17/2021 (Originally 08/24/2021)   HPV VACCINES  Discontinued   COVID-19 Vaccine  Discontinued    Discussed health benefits of physical activity, and encouraged her to engage in regular exercise appropriate for her age and condition.  Problem List Items Addressed This Visit   None Visit Diagnoses     Annual physical exam    -  Primary   Cervical cancer screening       Need for Tdap vaccination          No follow-ups on file.     Christen Butter, NP

## 2023-07-15 DIAGNOSIS — E1165 Type 2 diabetes mellitus with hyperglycemia: Secondary | ICD-10-CM | POA: Diagnosis not present

## 2023-07-15 DIAGNOSIS — E1169 Type 2 diabetes mellitus with other specified complication: Secondary | ICD-10-CM | POA: Diagnosis not present

## 2023-07-15 DIAGNOSIS — E1121 Type 2 diabetes mellitus with diabetic nephropathy: Secondary | ICD-10-CM | POA: Diagnosis not present

## 2023-08-15 DIAGNOSIS — E1121 Type 2 diabetes mellitus with diabetic nephropathy: Secondary | ICD-10-CM | POA: Diagnosis not present

## 2023-08-15 DIAGNOSIS — E1169 Type 2 diabetes mellitus with other specified complication: Secondary | ICD-10-CM | POA: Diagnosis not present

## 2023-08-15 DIAGNOSIS — E1165 Type 2 diabetes mellitus with hyperglycemia: Secondary | ICD-10-CM | POA: Diagnosis not present

## 2023-09-09 ENCOUNTER — Ambulatory Visit: Admitting: Podiatry

## 2023-09-14 DIAGNOSIS — E1121 Type 2 diabetes mellitus with diabetic nephropathy: Secondary | ICD-10-CM | POA: Diagnosis not present

## 2023-09-14 DIAGNOSIS — E1169 Type 2 diabetes mellitus with other specified complication: Secondary | ICD-10-CM | POA: Diagnosis not present

## 2023-09-14 DIAGNOSIS — E1165 Type 2 diabetes mellitus with hyperglycemia: Secondary | ICD-10-CM | POA: Diagnosis not present

## 2023-10-05 ENCOUNTER — Ambulatory Visit (INDEPENDENT_AMBULATORY_CARE_PROVIDER_SITE_OTHER): Admitting: Podiatry

## 2023-10-05 DIAGNOSIS — M79675 Pain in left toe(s): Secondary | ICD-10-CM

## 2023-10-05 DIAGNOSIS — B351 Tinea unguium: Secondary | ICD-10-CM | POA: Diagnosis not present

## 2023-10-05 DIAGNOSIS — M79674 Pain in right toe(s): Secondary | ICD-10-CM

## 2023-10-05 NOTE — Progress Notes (Signed)
   Chief Complaint  Patient presents with   Nail Problem    [facility]     SUBJECTIVE Patient with a history of diabetes mellitus presents to office today for routine diabetic footcare.  Doing well.  No new complaints  Past Medical History:  Diagnosis Date   Acute respiratory failure (HCC)    Allergic rhinitis    CAP (community acquired pneumonia)    Cardiac disease    CHF (congestive heart failure) (HCC)    Depression    Diabetes mellitus without complication (HCC)    Disturbance, sleep    ETOH abuse    Hyperlipidemia    Hypertension    Leukocytosis    Obesity    OSA (obstructive sleep apnea)    Sepsis (HCC)    Smoker    Tobacco abuse    Past Surgical History:  Procedure Laterality Date   NOSE SURGERY     Allergies  Allergen Reactions   Metformin Hcl Other (See Comments)   Pioglitazone Other (See Comments)    OBJECTIVE General Patient is awake, alert, and oriented x 3 and in no acute distress. Derm hyperkeratotic callus noted to the medial aspect of the left foot.  Hyperkeratotic dystrophic elongated nails noted 1-5 bilateral Vasc  DP and PT pedal pulses palpable bilaterally. Temperature gradient within normal limits.  Neuro light touch and protective threshold diminished bilateral.  Musculoskeletal Exam chronic collapse of the medial longitudinal arch and advanced PTTD with arthritic changes noted bilateral lower extremities left greater than the right.  There is pain on palpation throughout the midfoot and rear foot left.  Limited ankle joint dorsiflexion  ASSESSMENT 1. Diabetes Mellitus w/ peripheral neuropathy 2.  Symptomatic porokeratosis/callus left foot 3.  Severe pes planovalgus deformity left lower extremity with equinus 4.  Pain due to onychomycosis of Toenails both   PLAN OF CARE -Patient evaluated.   -Mechanical debridement of nails 1-5 bilateral was performed using a nail nipper without incident or bleeding - Excisional debridement of the  hyperkeratotic callus to the medial aspect of the left foot performed today using a tissue nipper without incident or bleeding. - Continue good supportive tennis shoes and sneakers.  Advised against going barefoot -Return to clinic PRN   Thresa EMERSON Sar, [name] [facility]  Dr. Thresa EMERSON Sar, [name]    [address] [place], [place] [address]                Office [phone]  Fax [phone]

## 2023-10-15 DIAGNOSIS — R7401 Elevation of levels of liver transaminase levels: Secondary | ICD-10-CM | POA: Diagnosis not present

## 2023-10-15 DIAGNOSIS — E1121 Type 2 diabetes mellitus with diabetic nephropathy: Secondary | ICD-10-CM | POA: Diagnosis not present

## 2023-10-15 DIAGNOSIS — I10 Essential (primary) hypertension: Secondary | ICD-10-CM | POA: Diagnosis not present

## 2023-10-15 DIAGNOSIS — E1165 Type 2 diabetes mellitus with hyperglycemia: Secondary | ICD-10-CM | POA: Diagnosis not present

## 2023-10-15 DIAGNOSIS — D696 Thrombocytopenia, unspecified: Secondary | ICD-10-CM | POA: Diagnosis not present

## 2023-10-15 DIAGNOSIS — E1169 Type 2 diabetes mellitus with other specified complication: Secondary | ICD-10-CM | POA: Diagnosis not present

## 2023-11-15 DIAGNOSIS — E1169 Type 2 diabetes mellitus with other specified complication: Secondary | ICD-10-CM | POA: Diagnosis not present

## 2023-11-15 DIAGNOSIS — E1121 Type 2 diabetes mellitus with diabetic nephropathy: Secondary | ICD-10-CM | POA: Diagnosis not present

## 2023-11-15 DIAGNOSIS — E1165 Type 2 diabetes mellitus with hyperglycemia: Secondary | ICD-10-CM | POA: Diagnosis not present

## 2023-12-15 DIAGNOSIS — E1165 Type 2 diabetes mellitus with hyperglycemia: Secondary | ICD-10-CM | POA: Diagnosis not present

## 2023-12-15 DIAGNOSIS — E1121 Type 2 diabetes mellitus with diabetic nephropathy: Secondary | ICD-10-CM | POA: Diagnosis not present

## 2023-12-15 DIAGNOSIS — E1169 Type 2 diabetes mellitus with other specified complication: Secondary | ICD-10-CM | POA: Diagnosis not present

## 2024-01-06 ENCOUNTER — Ambulatory Visit: Admitting: Podiatry

## 2024-01-18 ENCOUNTER — Ambulatory Visit: Admitting: Podiatry

## 2024-02-01 ENCOUNTER — Ambulatory Visit: Admitting: Podiatry

## 2024-02-04 ENCOUNTER — Other Ambulatory Visit: Payer: Self-pay | Admitting: Family Medicine

## 2024-02-04 DIAGNOSIS — R748 Abnormal levels of other serum enzymes: Secondary | ICD-10-CM

## 2024-02-09 ENCOUNTER — Institutional Professional Consult (permissible substitution) (HOSPITAL_BASED_OUTPATIENT_CLINIC_OR_DEPARTMENT_OTHER): Admitting: Internal Medicine

## 2024-02-24 ENCOUNTER — Ambulatory Visit: Admitting: Podiatry

## 2024-03-03 ENCOUNTER — Institutional Professional Consult (permissible substitution) (HOSPITAL_BASED_OUTPATIENT_CLINIC_OR_DEPARTMENT_OTHER): Admitting: Internal Medicine

## 2024-03-07 ENCOUNTER — Ambulatory Visit: Admitting: Podiatry

## 2024-03-21 ENCOUNTER — Ambulatory Visit: Admitting: Podiatry

## 2024-03-30 ENCOUNTER — Institutional Professional Consult (permissible substitution) (HOSPITAL_BASED_OUTPATIENT_CLINIC_OR_DEPARTMENT_OTHER): Admitting: Internal Medicine

## 2024-05-03 ENCOUNTER — Institutional Professional Consult (permissible substitution) (HOSPITAL_BASED_OUTPATIENT_CLINIC_OR_DEPARTMENT_OTHER): Admitting: Internal Medicine
